# Patient Record
Sex: Male | Born: 1975 | Race: White | Hispanic: No | Marital: Married | Smoking: Former smoker
Health system: Southern US, Community
[De-identification: ages and names within clinical notes are randomized; demographics above are authoritative.]

## PROBLEM LIST (undated history)

## (undated) DIAGNOSIS — K3184 Gastroparesis: Secondary | ICD-10-CM

## (undated) DIAGNOSIS — K5900 Constipation, unspecified: Secondary | ICD-10-CM

## (undated) HISTORY — DX: Gastroparesis: K31.84

## (undated) HISTORY — DX: Constipation, unspecified: K59.00

---

## 1997-12-08 ENCOUNTER — Emergency Department (HOSPITAL_COMMUNITY): Admission: EM | Admit: 1997-12-08 | Discharge: 1997-12-08 | Payer: Self-pay | Admitting: Emergency Medicine

## 2006-06-04 ENCOUNTER — Ambulatory Visit: Payer: Self-pay | Admitting: Gastroenterology

## 2006-06-04 LAB — CONVERTED CEMR LAB: TSH: 2.11 microintl units/mL (ref 0.35–5.50)

## 2006-07-03 ENCOUNTER — Ambulatory Visit: Payer: Self-pay | Admitting: Gastroenterology

## 2006-07-09 ENCOUNTER — Ambulatory Visit: Payer: Self-pay | Admitting: Gastroenterology

## 2006-07-13 ENCOUNTER — Ambulatory Visit: Payer: Self-pay | Admitting: Gastroenterology

## 2006-07-17 ENCOUNTER — Ambulatory Visit (HOSPITAL_COMMUNITY): Admission: RE | Admit: 2006-07-17 | Discharge: 2006-07-17 | Payer: Self-pay | Admitting: Gastroenterology

## 2006-08-11 ENCOUNTER — Ambulatory Visit: Payer: Self-pay | Admitting: Gastroenterology

## 2007-06-09 DIAGNOSIS — K5900 Constipation, unspecified: Secondary | ICD-10-CM | POA: Insufficient documentation

## 2007-06-09 DIAGNOSIS — K3184 Gastroparesis: Secondary | ICD-10-CM

## 2007-06-09 HISTORY — DX: Gastroparesis: K31.84

## 2007-06-09 HISTORY — DX: Constipation, unspecified: K59.00

## 2009-08-23 IMAGING — US US ABDOMEN COMPLETE
1 series · 14 of 25 positions shown · non-contrast
Comparison: NONE

CLINICAL DATA: Abdominal pain. 

ABDOMINAL ULTRASOUND

[Series 1: us abd · 0.22mm/px · 14 of 85 slices shown]
[im 1/85]
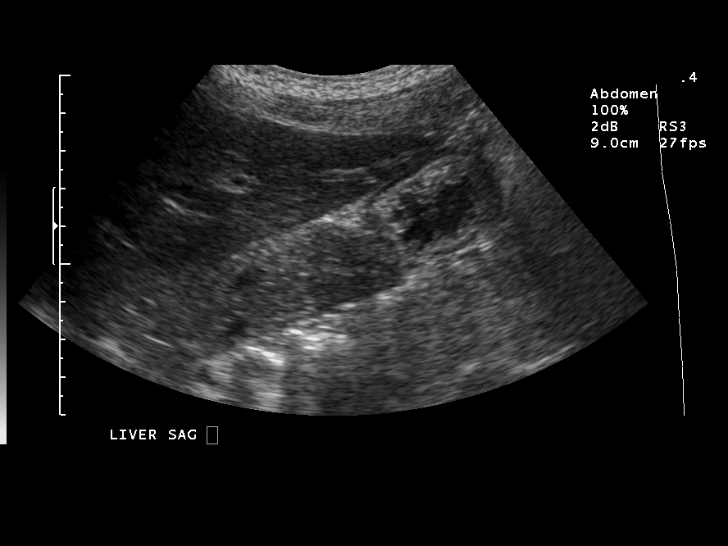
[im 8/85]
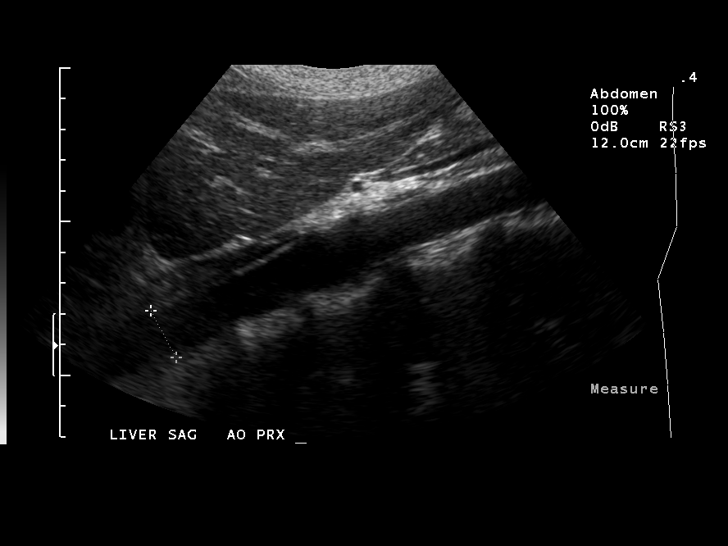
[im 15/85]
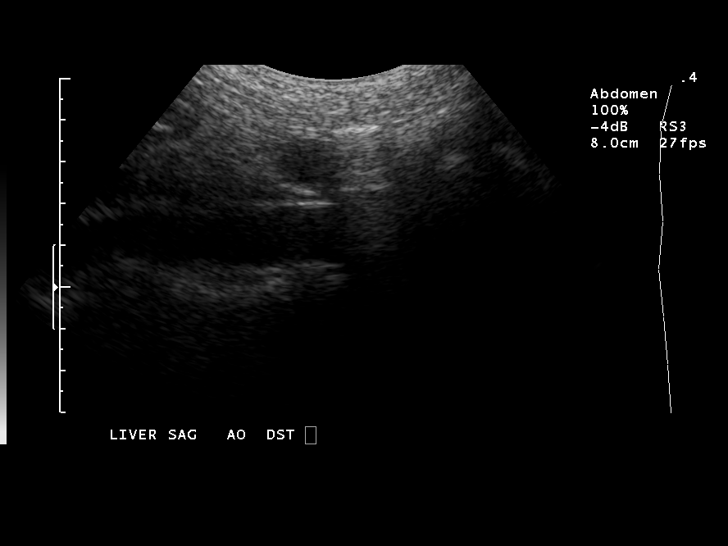
[im 22/85]
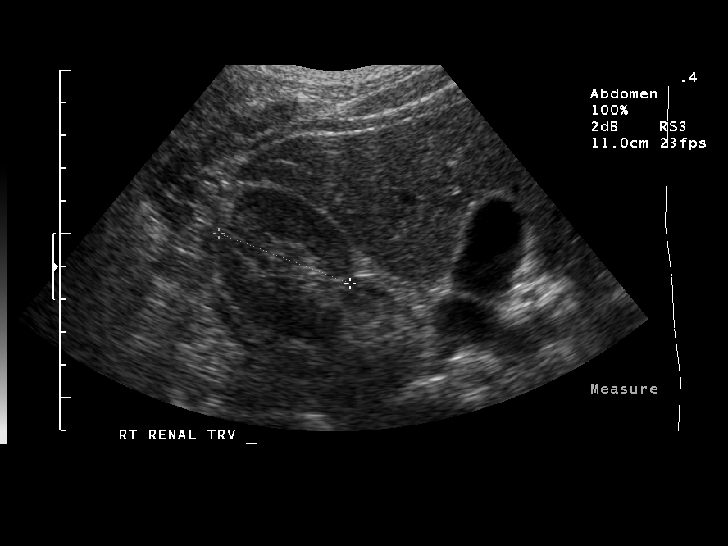
[im 29/85]
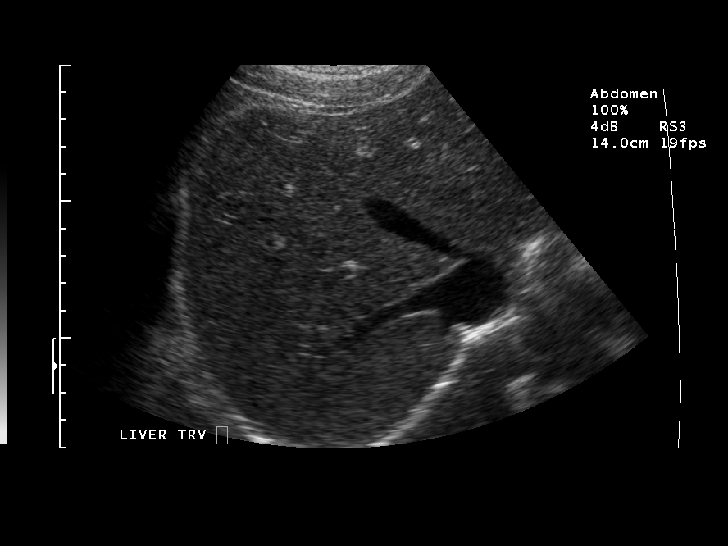
[im 32/85]
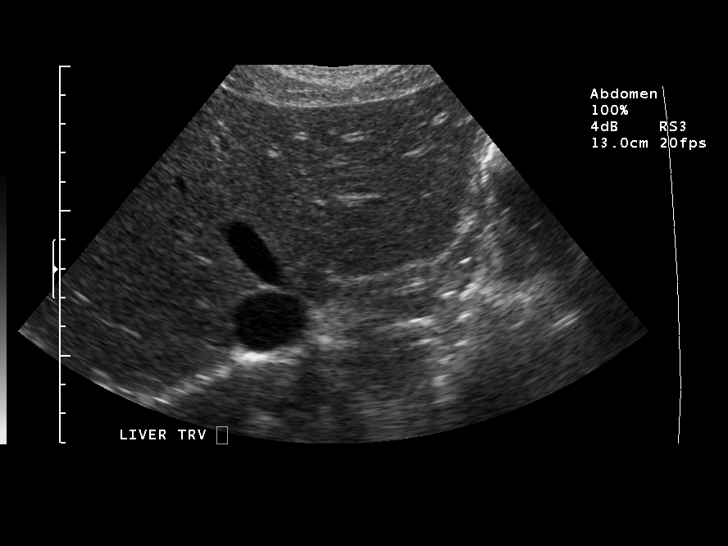
[im 39/85]
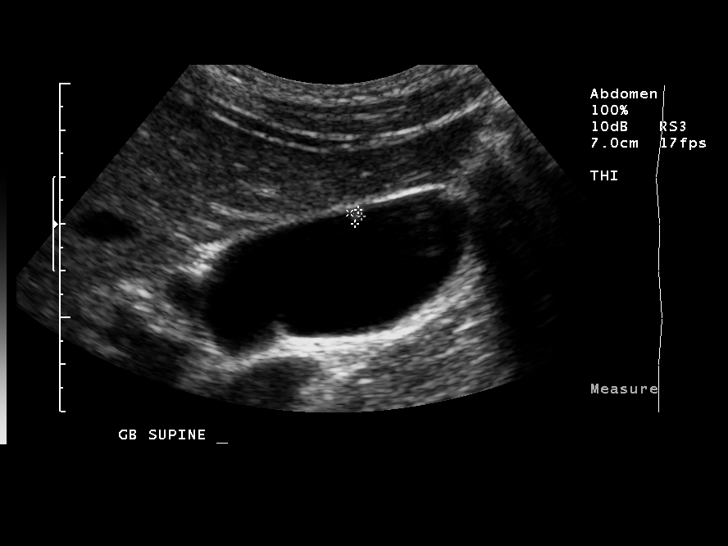
[im 46/85]
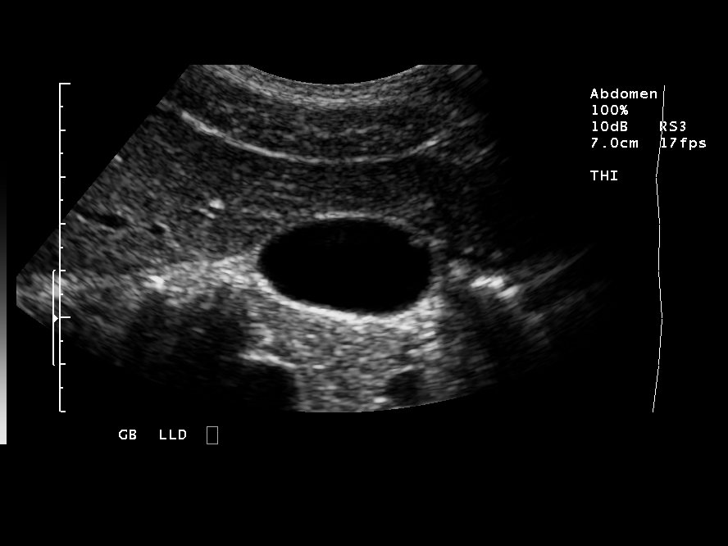
[im 53/85]
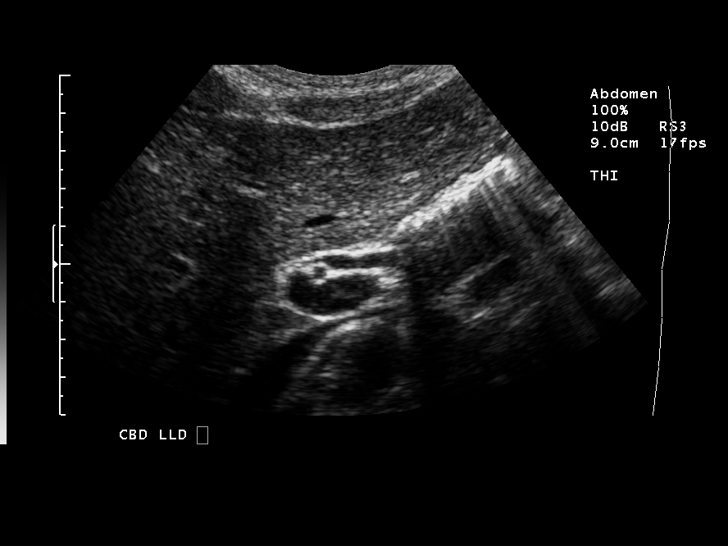
[im 57/85]
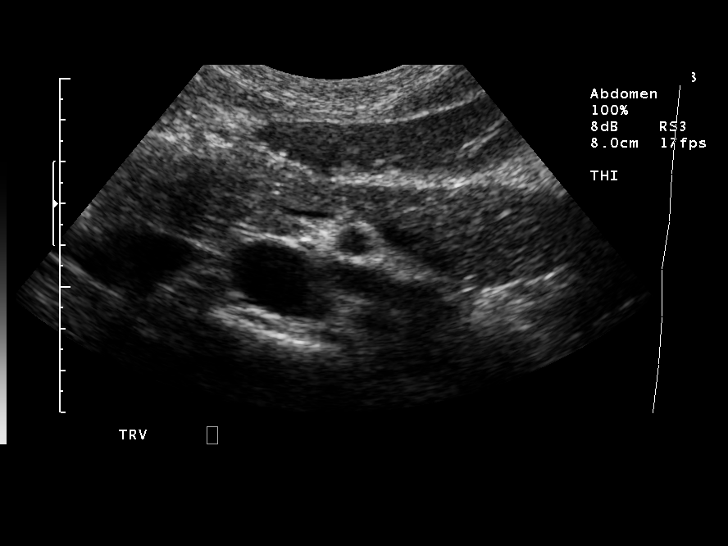
[im 64/85]
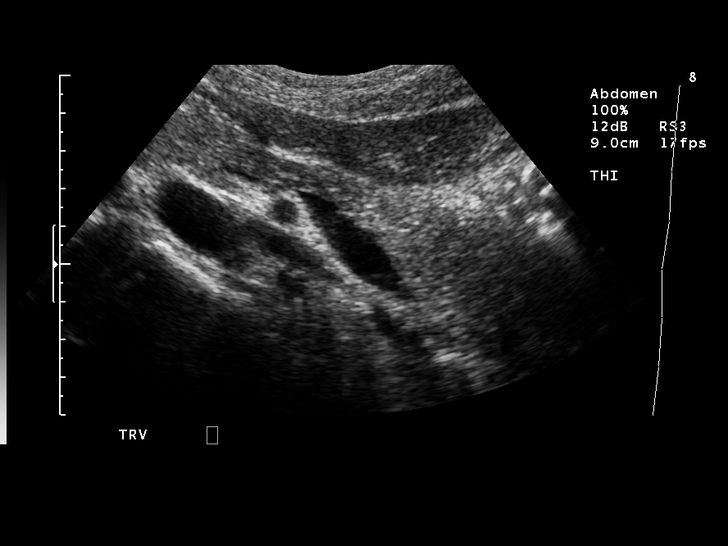
[im 71/85]
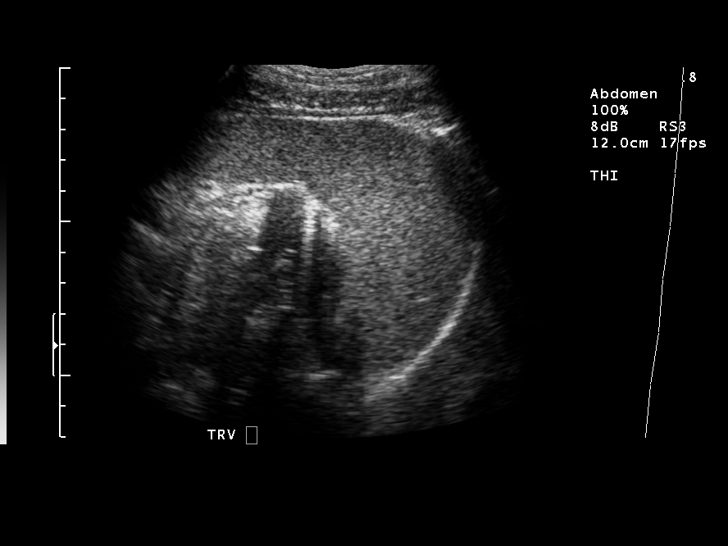
[im 78/85]
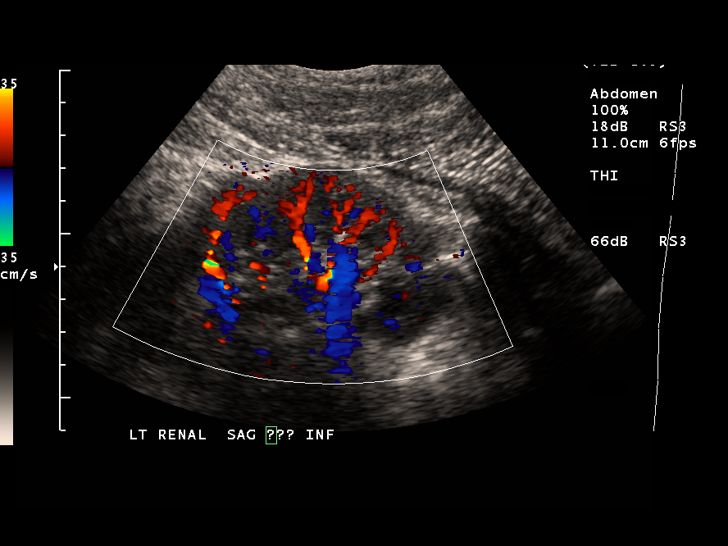
[im 85/85]
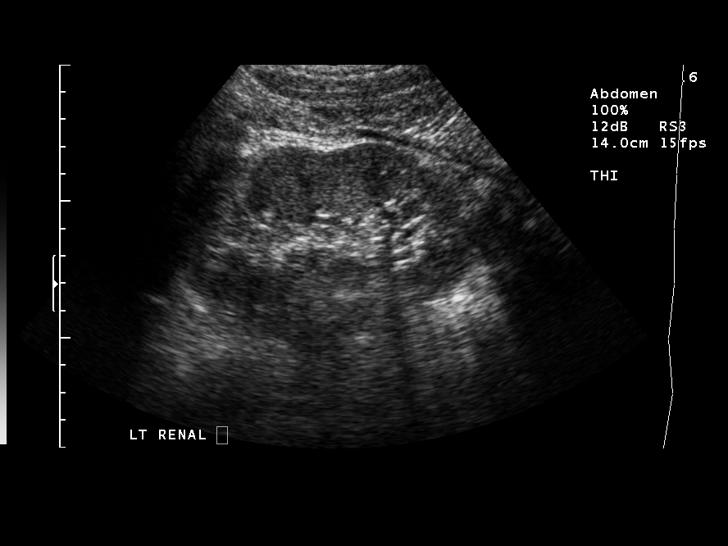

[14 of 25 positions shown; findings below may reference images not displayed]

FINDINGS: The liver is normal in size and echo appearance.  
Examination of the gallbladder demonstrates a solitary polyp 
within the gallbladder measuring 0.4 cm.  There were no stones 
identified. The common duct was non-dilated measuring 0.39 cm. The 
pancreas was normal in appearance.  The abdominal aorta was normal 
in caliber and echo appearance with AP measurements proximally of 
1.7 cm, mid 1.5 cm, and distally 1.2 cm.  The right kidney was 
normal in size and echo appearance measuring 11.4 cm. The left 
kidney measured 10.8 cm.  Echo appearances were normal.  The 
spleen measured 10.3 cm and was normal in echogenicity.
IMPRESSION: 0.4 cm gallbladder polyp otherwise negative exam. 
Abdelkbire Barhim, M.D. electronically reviewed on 12/31/2006 
Dict Date: 12/30/2006  Tran Date: 12/31/2006 CAV  TIREVICIENE

## 2010-03-12 NOTE — Procedures (Signed)
Summary: Gastroenterology Col  Gastroenterology Col   Imported By: June McMurray CMA 06/10/2007 12:10:16  _____________________________________________________________________  External Attachment:    Type:   Image     Comment:   External Document

## 2010-03-12 NOTE — Procedures (Signed)
Summary: Gastroenterology [facility]  Gastroenterology [facility]   Imported By: June McMurray CMA 06/10/2007 12:11:25  _____________________________________________________________________  External Attachment:    Type:   Image     Comment:   External Document

## 2010-06-25 NOTE — Assessment & Plan Note (Signed)
[facility]                         GASTROENTEROLOGY OFFICE NOTE   Henry Harrington, Henry Harrington                         MRN:          [id number]  DATE:07/13/2006                            DOB:          [birth year]    PROBLEM:  Abdominal pain.   [name] has returned complaining of abdominal pain. He awakened this  morning with severe right upper quadrant pain. It was without radiation.  It moved over to his left abdomen. He vomited once. He denies fever or  chills. A colonoscopy done 3 years ago was entirely normal as was his  upper endoscopy. He remains on Omeprazole daily.   PHYSICAL EXAMINATION:  VITAL SIGNS:  Pulse 58, blood pressure 112/66,  weight 127.  ABDOMEN:  Flat and without masses, tenderness or organomegaly. Bowel  sounds are active.   IMPRESSION:  Nonspecific abdominal pain.   RECOMMENDATIONS:  1. The patient is scheduled for a gastric-emptying exam.  2. NuLev 0.25 mg sublingual q.4 h p.r.n.     Barbette Hair. Arlyce Dice, MD,FACG  Electronically Signed    RDK/MedQ  DD: 07/13/2006  DT: 07/13/2006  Job #: [id number]   cc:   Peyton Najjar, MD

## 2010-06-25 NOTE — Assessment & Plan Note (Signed)
[facility]                         GASTROENTEROLOGY OFFICE NOTE   HAWKINS, SEAMAN                         MRN:          [id number]  DATE:08/11/2006                            DOB:          1976/02/05    PROBLEM:  Abdominal pain and nausea.   Mr. Glantz has returned for reevaluation.  Gastric emptying scan showed  marked delay with 62% retention in 2 hours.  Since starting Reglan 10 mg  one-half hour a.c. and h.s., he has been entirely symptom-free.  He is  without GI complaints.   EXAMINATION:  Pulse 60, blood pressure 86/60, weight 131.   IMPRESSION:  Idiopathic gastroparesis.   RECOMMENDATIONS:  1. Continue Reglan for at least 3 months.  At that point we will      attempt to taper it.  2. Hold omeprazole.     Barbette Hair. Arlyce Dice, MD,FACG  Electronically Signed    RDK/MedQ  DD: 08/11/2006  DT: 08/12/2006  Job #: [id number]   cc:   Peyton Najjar, MD

## 2010-06-28 NOTE — Assessment & Plan Note (Signed)
[facility]                         GASTROENTEROLOGY OFFICE NOTE   LEWAYNE, PAULEY                MRN:          [id number]  DATE:06/04/2006                            DOB:          [birth year]    REFERRING PHYSICIAN:  Peyton Najjar, MD   PROBLEM:  Constipation.   HISTORY OF PRESENT ILLNESS:  Henry Harrington is a pleasant, generally healthy, 35-  year-old white male with no other known chronic medical problems. He has  not had any previous GI disease or abdominal surgeries. He has had  relatively new onset of constipation since January or February of 2008.  He is not aware of any changes in his diet, activity level, etc., To  precipitate the constipation and is not on any regular medications. He  said that he had always had normal daily bowel movements and then got  constipated and was unable to have a bowel movement for 3 to 4 days at a  stretch. He actually developed some left sided abdominal discomfort with  significant constipation and was seen at Urgent Care. Had a KUB which  was negative and then was started on some MiraLax, which he has been  taking for the past month or so. He says that MiraLax works very well  with once daily dosing and he is having about 3 bowel movement per day.  He has never noticed any melena or hematochezia. Denies any abdominal  discomfort. His appetite is fine. His weight has been stable. He has  been trying to increase fiber in his diet and has been drinking a lot of  fluids.   CURRENT MEDICATIONS:  MiraLax 17 grams once daily.   ALLERGIES:  NO KNOWN DRUG ALLERGIES.   PAST MEDICAL HISTORY:  Is completely benign.   SOCIAL HISTORY:  The patient is married. He is a Psychiatrist. Does  not have any children. He is a nonsmoker, nondrinker.   FAMILY HISTORY:  Negative for colon cancer, polyps, or inflammatory  bowel disease, as far as he is aware.   REVIEW OF SYSTEMS:  HEENT:  Pertinent for eye glasses. BACK:   He does  have some intermittent back pain and muscle cramping. GENITOURINARY:  Negative. GASTROINTESTINAL:  As above. CARDIOVASCULAR:  Negative. All  other review of systems reviewed and negative.   PHYSICAL EXAMINATION:  GENERAL:  A well developed, thin, white male in  no acute distress.  VITAL SIGNS:  Height 5 foot 5. Weight is 128. Blood pressure 98/64.  Pulse in the 60's.  HEENT:  Normocephalic and atraumatic. EO MI. PERRLA. Sclerae anicteric.  NECK:  Supple without nodes.  CARDIOVASCULAR:  Regular rate and rhythm. S1 and S2. No murmur, rub, or  gallop.  ABDOMEN:  Soft. Bowel sounds active. He is non-tender. No mass or  hepatosplenomegaly.  RECTAL:  Examination is not done at this time.  EXTREMITIES:  Without clubbing, cyanosis, or edema.  NEUROLOGIC:  Grossly nonfocal. Alert and oriented times three and  appropriate.   LABORATORY DATA:  The patient did have baseline labs done through Urgent  Care with C-met completely normal. Amylase and lipase normal. WBC is  6.8.  Hemoglobin and hematocrit 15.2 and 44.7. These were done on May 26, 2006.   IMPRESSION:  A 35 year old white male with new onset constipation times  3 months. Suspect functional constipation, possible irritable bowel  syndrome. No alarm symptoms.   PLAN:  1. Check TSH.  2. Continue liberal fluids.  3. Have encouraged him to take a daily fiber supplement in the form of      Benefiber or Fiber-Sure and try to wean himself off of the MiraLax.      If this is ineffective, he may resume the MiraLax, perhaps on an      every other day basis.  4. He is to return p.r.n.  5. If his symptoms worsen, would then consider colonoscopy.      Mike Gip, PA-C  Electronically Signed      Barbette Hair. Arlyce Dice, MD,FACG  Electronically Signed   AE/MedQ  DD: 06/04/2006  DT: 06/04/2006  Job #: [id number]   cc:   Peyton Najjar, MD

## 2013-12-27 ENCOUNTER — Ambulatory Visit (INDEPENDENT_AMBULATORY_CARE_PROVIDER_SITE_OTHER): Payer: Self-pay | Admitting: Family Medicine

## 2013-12-27 VITALS — BP 118/78 | HR 93 | Temp 97.5°F | Resp 16 | Ht 67.0 in | Wt 160.6 lb

## 2013-12-27 DIAGNOSIS — L03114 Cellulitis of left upper limb: Secondary | ICD-10-CM

## 2013-12-27 MED ORDER — CEPHALEXIN 500 MG PO CAPS: 500.0000 mg | ORAL_CAPSULE | Freq: Three times a day (TID) | ORAL | Status: AC

## 2013-12-27 NOTE — Progress Notes (Signed)
D/w and agree with A/P . Dr Conley RollsLe

## 2013-12-27 NOTE — Progress Notes (Signed)
    MRN: [id number] DOB: 1975-11-21  Subjective:   Henry Harrington is a 38 y.o. male presenting for 2 day history of new onset elbow pain. First noticed elbow pain two days ago when he rested arms on a table top. Saw that it was red and swollen. 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 Ibuprofen has helped with pain, has been using ice but swelling is still increasing, elbow is also now warm to the touch. Denies numbness, tingling, loss of sensation, loss of ROM, fevers, n/v, diarrhea, chest pain, sob, confusion. Denies smoking or alcohol use. Denies any other aggravating or relieving factors, no other questions or concerns.  No current medications.   No Known Allergies  History reviewed. No pertinent past medical history.  History reviewed. No pertinent past surgical history.  ROS As in subjective.   Objective:   Vitals: BP 118/78 mmHg  Pulse 93  Temp(Src) 97.5 F (36.4 C) (Oral)  Resp 16  Ht 5\' 7"  (1.702 m)  Wt 160 lb 9.6 oz (72.848 kg)  BMI 25.15 kg/m2  SpO2 97%  Physical Exam  Constitutional: He is oriented to person, place, and time and well-developed, well-nourished, and in no distress. No distress.  Cardiovascular: Normal rate.  Exam reveals no gallop and no friction rub.   No murmur heard. Pulmonary/Chest: Effort normal. No respiratory distress.  Neurological: He is alert and oriented to person, place, and time.  Skin: Skin is warm and dry. He is not diaphoretic. There is erythema.     Psychiatric: Affect normal.     Assessment and Plan :   1. Cellulitis of left upper extremity - Likely cellulitis, Rx Keflex, warm compresses, if no resolution of symptoms with Keflex consider MRSA, bursitis otherwise return to clinic if symptoms worsen, fail to resolve or as needed - cephALEXin (KEFLEX) 500 MG capsule; Take 1 capsule (500 mg total) by mouth 3 (three) times daily.  Dispense: 30 capsule; Refill:  0   Wallis BambergMario Lenda Baratta, PA-C [facility] [phone] 12/27/2013 5:35 PM

## 2013-12-27 NOTE — Patient Instructions (Signed)

## 2013-12-30 ENCOUNTER — Telehealth: Payer: Self-pay | Admitting: Urgent Care

## 2013-12-30 NOTE — Telephone Encounter (Signed)
Spoke with patient to follow up on progress. Patient reports that since starting Keflex, his cellulitis has improved, redness, swelling and pain around his elbow has decreased significantly. Advised patient to complete antibiotic course and advised to return to clinic if symptoms worsen, fail to resolve or as needed.  Wallis BambergMario Georjean Toya, PA-C [facility] [phone] 12/30/2013  8:31 AM

## 2014-01-06 ENCOUNTER — Telehealth: Payer: Self-pay

## 2014-01-06 NOTE — Telephone Encounter (Signed)
Patient was seen for cellulitis on elbow and still has a bump- still large and has taken meds for it only has one pill left to take. He is concernd if there is fluid in it or can we drain? Patient wants to know what to do for this. Please advise. He has only used warm compress once. No change.   Best: [phone]

## 2014-01-07 ENCOUNTER — Ambulatory Visit (INDEPENDENT_AMBULATORY_CARE_PROVIDER_SITE_OTHER): Payer: Self-pay | Admitting: Family Medicine

## 2014-01-07 VITALS — BP 116/78 | HR 60 | Temp 98.2°F | Resp 16 | Ht 66.5 in | Wt 158.4 lb

## 2014-01-07 DIAGNOSIS — M7022 Olecranon bursitis, left elbow: Secondary | ICD-10-CM

## 2014-01-07 MED ORDER — CEPHALEXIN 500 MG PO CAPS: 500.0000 mg | ORAL_CAPSULE | Freq: Three times a day (TID) | ORAL | Status: AC

## 2014-01-07 NOTE — Telephone Encounter (Signed)
Spoke to pt. The way he describes the "bump" on his elbow makes it sound like he could be developing an abscess. I advised the pt to come in either today or tomorrow to be re-evaluated. I spoke to Gurney MaxinMike Mani regarding pt and he agrees that patient needs to come back for a re-check. Pt is concerned about financial situation; I tried to reassure him that we would work with him on cost the best we could. Hopefully pt will return for a re-check.

## 2014-01-07 NOTE — Patient Instructions (Signed)
Olecranon Bursitis Bursitis is swelling and soreness (inflammation) of a fluid-filled sac (bursa) that covers and protects a joint. Olecranon bursitis occurs over the elbow.  CAUSES Bursitis can be caused by injury, overuse of the joint, arthritis, or infection.  SYMPTOMS   Tenderness, swelling, warmth, or redness over the elbow.  Elbow pain with movement. This is greater with bending the elbow.  Squeaking sound when the bursa is rubbed or moved.  Increasing size of the bursa without pain or discomfort.  Fever with increasing pain and swelling if the bursa becomes infected. HOME CARE INSTRUCTIONS   Put ice on the affected area.  Put ice in a plastic bag.  Place a towel between your skin and the bag.  Leave the ice on for 15-20 minutes each hour while awake. Do this for the first 2 days.  When resting, elevate your elbow above the level of your heart. This helps reduce swelling.  Continue to put the joint through a full range of motion 4 times per day. Rest the injured joint at other times. When the pain lessens, begin normal slow movements and usual activities.  Only take over-the-counter or prescription medicines for pain, discomfort, or fever as directed by your caregiver.  Reduce your intake of milk and related dairy products (cheese, yogurt). They may make your condition worse. SEEK IMMEDIATE MEDICAL CARE IF:   Your pain increases even during treatment.  You have a fever.  You have heat and inflammation over the bursa and elbow.  You have a red line that goes up your arm.  You have pain with movement of your elbow. MAKE SURE YOU:   Understand these instructions.  Will watch your condition.  Will get help right away if you are not doing well or get worse. Document Released: 02/26/2006 Document Revised: 04/21/2011 Document Reviewed: 01/12/2007 Exit[employer] Patient Information 2015 [employer]. This information is not intended to replace advice given to you by your  health care provider. Make sure you discuss any questions you have with your health care provider.  

## 2014-01-08 NOTE — Progress Notes (Signed)
    MRN: [id number] DOB: [birth year]  Subjective:   Henry Harrington is a 38 y.o. male presenting for 2 week history of elbow swelling. Patient was seen here on 12/27/2013 for same complaint, treated conservatively with Keflex for cellulitis. Discussed possibility of septic bursitis and need for doxycycline with patient at previous visit but due to financial restraints, patient decided to go with a more conservative approach. Today, he notes significant improvement in erythema and swelling. Patient completed course of Keflex 01/06/2014, is concerned that swelling right over his elbow persists despite antibiotics and warm compresses. Denies fevers, n/v, diarrhea, decreased ROM or sensation, pain, streaking from elbow, pus, drainage. Denies any other aggravating or relieving factors, no other questions or concerns.  Prior to Admission medications   Medication Sig Start Date End Date Taking? Authorizing Provider  cephALEXin (KEFLEX) 500 MG capsule Take 1 capsule (500 mg total) by mouth 3 (three) times daily. 01/07/14 01/12/14  Wallis BambergMario Jontae Sonier, PA-C   No Known Allergies  Denies pmh.  Denies psh.  ROS As in subjective.   Objective:   Vitals: BP 116/78 mmHg  Pulse 60  Temp(Src) 98.2 F (36.8 C) (Oral)  Resp 16  Ht 5' 6.5" (1.689 m)  Wt 158 lb 6 oz (71.838 kg)  BMI 25.18 kg/m2  SpO2 99%  Physical Exam  Constitutional: He is oriented to person, place, and time and well-developed, well-nourished, and in no distress.  Cardiovascular: Normal rate.   Pulmonary/Chest: Effort normal.  Musculoskeletal: Normal range of motion. He exhibits edema (mild edema over olecranon process). He exhibits no tenderness.  Neurological: He is alert and oriented to person, place, and time.  Skin: Skin is warm and dry. No rash noted. He is not diaphoretic. There is erythema (mild erythema over olecranon process).    Assessment and Plan :   1. Olecranon bursitis, left - cellulitis significantly improved - will Rx an  additional 5 days of Keflex and warm compresses for olecranon bursitis - if no improvement with additional antibiotics consider aspiration and culture - cephALEXin (KEFLEX) 500 MG capsule; Take 1 capsule (500 mg total) by mouth 3 (three) times daily.  Dispense: 15 capsule; Refill: 0   Wallis BambergMario Bevan Vu, PA-C [facility] [phone] 01/07/2014  17:01

## 2014-01-10 NOTE — Progress Notes (Signed)
Agree with A/P,pt elbow examined, options reviewed with pt, risk and benfits reviewed with conservative management vs aspiration. Dr Conley RollsLe

## 2014-09-13 ENCOUNTER — Ambulatory Visit (INDEPENDENT_AMBULATORY_CARE_PROVIDER_SITE_OTHER): Payer: 59 | Admitting: Physician Assistant

## 2014-09-13 VITALS — BP 118/76 | HR 66 | Temp 98.0°F | Resp 16 | Ht 67.0 in | Wt 153.4 lb

## 2014-09-13 DIAGNOSIS — H10021 Other mucopurulent conjunctivitis, right eye: Secondary | ICD-10-CM

## 2014-09-13 MED ORDER — MOXIFLOXACIN HCL 0.5 % OP SOLN - NO CHARGE: 1.0000 [drp] | Freq: Three times a day (TID) | OPHTHALMIC | Status: AC

## 2014-09-13 NOTE — Progress Notes (Signed)
   Patient ID: Henry Harrington, male    DOB: 20-Feb-1975, 39 y.o.   MRN: [id number]  PCP: No PCP Per Patient  Subjective:   Chief Complaint  Patient presents with  . Eye Problem     rt., pt. thinks pink eye, x this morning     HPI Presents for evaluation of right eye redness and pain.   He states that this morning when he woke up it was matted closed and when he opened it it was red. Pt denies FB sensation, changes in vision, pruritis, pain, or burning. He states that the eye feels "scratchy" when he blinks. Pt denies recent URI or infection. Pt reports watery discharge from the eye. Pt states that his left eye itches but has not been watery, painful, or red. Pt denies fever. Pt states that his wife put an OTC drop in his right eye this morning. He denies trying any other medications or hot/cold compress.   Pt states that 2 of his 3 children have been diagnosed with pink eye this week. He stays at home with his 3 children and denies working outside, use of power tools, or any circumstance in which chemicals, metal, wood, or other objects could have gotten in his eye. Pt denies a history of environmental/seasonal allergies.  Review of Systems Constitutional: Negative.  HENT: Negative for congestion, ear pain, postnasal drip, sinus pressure, sneezing, sore throat, tinnitus, trouble swallowing and voice change.  Eyes: Positive for discharge, redness and itching. Negative for photophobia, pain and visual disturbance.  Respiratory: Negative.  Cardiovascular: Negative.  Gastrointestinal: Negative.  Skin: Negative.  Allergic/Immunologic: Negative.  Neurological: Negative.      Patient Active Problem List   Diagnosis Date Noted  . 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 CONSTIPATION 06/09/2007     Prior to Admission medications   Medication Sig Start Date End Date Taking? Authorizing Provider  NONE   No Known Allergies     Objective:  Physical Exam  Constitutional: He is oriented to person,  place, and time. He appears well-developed and well-nourished. He is active and cooperative. No distress.  BP 118/76 mmHg  Pulse 66  Temp(Src) 98 F (36.7 C) (Oral)  Resp 16  Ht  (1.702 m)  Wt 153 lb 6.4 oz (69.582 kg)  BMI 24.02 kg/m2  SpO2 99%   HENT:  Head: Normocephalic and atraumatic.  Eyes: EOM and lids are normal. Pupils are equal, round, and reactive to light. Right conjunctiva is injected. Right conjunctiva has no hemorrhage. Left conjunctiva is not injected. Left conjunctiva has no hemorrhage.  Crusting on the lashes. Some yellowish drainage noted at the medial and lateral canthi.  Pulmonary/Chest: Effort normal.  Neurological: He is alert and oriented to person, place, and time.  Psychiatric: He has a normal mood and affect. His speech is normal and behavior is normal.           Assessment & Plan:   1. Pink eye disease of right eye Anticipatory guidance provided. Infection control to reduce risk on transmission reviewed. - moxifloxacin (VIGAMOX) 0.5 % SOLN; Place 1 drop into the right eye 3 (three) times daily.  Dispense: 3 mL; Refill: 0   Fernande Bras, PA-C Physician Assistant-Certified [facility]

## 2014-09-13 NOTE — Patient Instructions (Addendum)
You can also try warm compresses over the affected eye for symptom relief. Clean all the surfaces in your house, repeatedly until everyone's symptoms are resolved. Wash your bed linens frequently. Wash your hands regularly, and use an antibacterial hand sanitizer in between. Please come back to clinic if you experience fever, chills, or any loss of vision.

## 2014-09-13 NOTE — Progress Notes (Signed)
   Subjective:    Patient ID: Henry Harrington, male    DOB: Dec 19, 1975, 39 y.o.   MRN: [id number]  HPI Pt presents for evaluation of his right eye. He states that this morning when he woke up it was matted closed and when you opened it it was red. Pt denies FB sensation, changes in vision, pruritis, pain, or burning. He states that the eye feels "scratchy" when he blinks. Pt denies recent URI or infection. Pt reports watery discharge from the eye. Pt states that his left eye itches but has not been watery, painful, or red. Pt denies fever. Pt states that his wife put an OTC drop in his right eye this morning. He denies trying any other medications or hot/cold compress. Pt states that 2 of his 3 children have been diagnosed with pink eye this week. He stays at home with his 3 children and denies working outside, use of power tools, or any circumstance in which chemicals, metal, wood, or other objects could have gotten in his eye. Pt denies a history of environmental/seasonal allergies. Review of Systems  Constitutional: Negative.   HENT: Negative for congestion, ear pain, postnasal drip, sinus pressure, sneezing, sore throat, tinnitus, trouble swallowing and voice change.   Eyes: Positive for discharge, redness and itching. Negative for photophobia, pain and visual disturbance.  Respiratory: Negative.   Cardiovascular: Negative.   Gastrointestinal: Negative.   Skin: Negative.   Allergic/Immunologic: Negative.   Neurological: Negative.       Objective:   Physical Exam  Constitutional: He is oriented to person, place, and time. He appears well-developed and well-nourished. No distress.  HENT:  Head: Normocephalic and atraumatic.    Right Ear: External ear normal.  Left Ear: External ear normal.  Eyes: EOM are normal. Pupils are equal, round, and reactive to light. Right eye exhibits discharge. Left eye exhibits no discharge. No scleral icterus.  Neck: Normal range of motion. Neck supple. No  tracheal deviation present. No thyromegaly present.  Lymphadenopathy:    He has no cervical adenopathy.  Neurological: He is alert and oriented to person, place, and time.  Skin: Skin is warm and dry. No rash noted. He is not diaphoretic. No erythema. No pallor.  Psychiatric: He has a normal mood and affect. His behavior is normal. Judgment and thought content normal.          Assessment & Plan:

## 2014-11-20 ENCOUNTER — Encounter: Payer: Self-pay | Admitting: Physician Assistant

## 2014-11-20 ENCOUNTER — Ambulatory Visit (INDEPENDENT_AMBULATORY_CARE_PROVIDER_SITE_OTHER): Payer: 59 | Admitting: Physician Assistant

## 2014-11-20 VITALS — BP 106/70 | HR 57 | Temp 98.4°F | Resp 16 | Ht 66.75 in | Wt 152.2 lb

## 2014-11-20 DIAGNOSIS — Z1329 Encounter for screening for other suspected endocrine disorder: Secondary | ICD-10-CM

## 2014-11-20 DIAGNOSIS — Z23 Encounter for immunization: Secondary | ICD-10-CM

## 2014-11-20 DIAGNOSIS — Z114 Encounter for screening for human immunodeficiency virus [HIV]: Secondary | ICD-10-CM

## 2014-11-20 DIAGNOSIS — Z130 Encounter for screening for diseases of the blood and blood-forming organs and certain disorders involving the immune mechanism: Secondary | ICD-10-CM

## 2014-11-20 DIAGNOSIS — Z13228 Encounter for screening for other metabolic disorders: Secondary | ICD-10-CM

## 2014-11-20 DIAGNOSIS — Z0000 Encounter for general adult medical examination without abnormal findings: Secondary | ICD-10-CM | POA: Diagnosis not present

## 2014-11-20 DIAGNOSIS — Z13220 Encounter for screening for lipoid disorders: Secondary | ICD-10-CM | POA: Diagnosis not present

## 2014-11-20 LAB — LIPID PANEL
Cholesterol: 162 mg/dL (ref 125–200)
HDL: 33 mg/dL — ABNORMAL LOW (ref >=40)
LDL Cholesterol: 119 mg/dL (ref <130)
Total CHOL/HDL Ratio: 4.9 Ratio (ref <=5.0)
Triglycerides: 48 mg/dL (ref <150)
VLDL: 10 mg/dL (ref <30)

## 2014-11-20 LAB — CBC
HCT: 43.4 % (ref 39.0–52.0)
Hemoglobin: 14.9 g/dL (ref 13.0–17.0)
MCH: 30.3 pg (ref 26.0–34.0)
MCHC: 34.3 g/dL (ref 30.0–36.0)
MCV: 88.2 fL (ref 78.0–100.0)
MPV: 9.7 fL (ref 8.6–12.4)
Platelets: 217 10*3/uL (ref 150–400)
RBC: 4.92 MIL/uL (ref 4.22–5.81)
RDW: 13.1 % (ref 11.5–15.5)
WBC: 5.3 10*3/uL (ref 4.0–10.5)

## 2014-11-20 LAB — POCT URINALYSIS DIP (MANUAL ENTRY)
Bilirubin, UA: NEGATIVE
Blood, UA: NEGATIVE
Glucose, UA: NEGATIVE
Ketones, POC UA: NEGATIVE
Leukocytes, UA: NEGATIVE
Nitrite, UA: NEGATIVE
Protein Ur, POC: NEGATIVE
Spec Grav, UA: 1.025
Urobilinogen, UA: 0.2
pH, UA: 5

## 2014-11-20 LAB — COMPREHENSIVE METABOLIC PANEL
ALT: 20 U/L (ref 9–46)
AST: 17 U/L (ref 10–40)
Albumin: 4.3 g/dL (ref 3.6–5.1)
Alkaline Phosphatase: 68 U/L (ref 40–115)
BUN: 12 mg/dL (ref 7–25)
CO2: 28 mmol/L (ref 20–31)
Calcium: 9.1 mg/dL (ref 8.6–10.3)
Chloride: 107 mmol/L (ref 98–110)
Creat: 0.93 mg/dL (ref 0.60–1.35)
Glucose, Bld: 90 mg/dL (ref 65–99)
Potassium: 4.3 mmol/L (ref 3.5–5.3)
Sodium: 136 mmol/L (ref 135–146)
Total Bilirubin: 0.4 mg/dL (ref 0.2–1.2)
Total Protein: 6.7 g/dL (ref 6.1–8.1)

## 2014-11-20 LAB — HIV ANTIBODY (ROUTINE TESTING W REFLEX): HIV 1&2 Ab, 4th Generation: NONREACTIVE

## 2014-11-20 LAB — TSH: TSH: 2.405 u[IU]/mL (ref 0.350–4.500)

## 2014-11-20 NOTE — Progress Notes (Signed)
Subjective:    Patient ID: Henry Harrington, male    DOB: 04-Mar-1975, 39 y.o.   MRN: [id number]  PCP: Kobe Jansma, PA-C  Chief Complaint  Patient presents with  . Annual Exam    HPI  Presents for annual wellness visit.  No concerns or complaints. No problems to address.   No Known Allergies   Prior to Admission medications   Not on File    Patient Active Problem List   Diagnosis Date Noted  . 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 CONSTIPATION 06/09/2007    History reviewed. No pertinent past medical history.  Social History   Social History  . Marital Status: Married    Spouse Name: Zac Torti  . Number of Children: 3  . Years of Education: 12th grade   Occupational History  . 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 Smoking status: Former Smoker    Quit date: 02/10/1997  . Smokeless tobacco: Never Used  . Alcohol Use: No  . Drug Use: No  . Sexual Activity:    Partners: Female   Other Topics Concern  . Not on file   Social History Narrative   Exercise: No.    He has two daughters and one son.    Former Engineer, manufacturing       Family History  Problem Relation Age of Onset  . Cancer Father   . Stroke Father     Review of Systems  Constitutional: Negative.   HENT: Negative.   Eyes: Negative.   Cardiovascular: Negative.   Gastrointestinal: Negative.   Endocrine: Negative.   Genitourinary: Negative.   Musculoskeletal: Negative.   Skin: Negative.   Allergic/Immunologic: Negative.   Neurological: Negative.   Hematological: Negative.   Psychiatric/Behavioral: Negative.    Depression screen [facility] 2/9 11/20/2014 09/13/2014  Decreased Interest 0 0  Down, Depressed, Hopeless 0 0  PHQ - 2 Score 0 0        Objective:   Physical Exam  Constitutional: He is oriented to person, place, and time. Vital signs are normal. He appears well-developed and well-nourished. He is active and cooperative.  Non-toxic appearance. He does not have a sickly  appearance. He does not appear ill. No distress.  HENT:  Head: Normocephalic and atraumatic.  Right Ear: Hearing, tympanic membrane, external ear and ear canal normal.  Left Ear: Hearing, tympanic membrane, external ear and ear canal normal.  Nose: Nose normal.  Mouth/Throat: Uvula is midline, oropharynx is clear and moist and mucous membranes are normal. He does not have dentures. No oral lesions. No trismus in the jaw. Normal dentition. No dental abscesses, uvula swelling, lacerations or dental caries.  Eyes: Conjunctivae, EOM and lids are normal. Pupils are equal, round, and reactive to light. Right eye exhibits no discharge. Left eye exhibits no discharge. No scleral icterus.  Fundoscopic exam:      The right eye shows no arteriolar narrowing, no AV nicking, no exudate, no hemorrhage and no papilledema.       The left eye shows no arteriolar narrowing, no AV nicking, no exudate, no hemorrhage and no papilledema.  Neck: Normal range of motion, full passive range of motion without pain and phonation normal. Neck supple. No spinous process tenderness and no muscular tenderness present. No rigidity. No tracheal deviation, no edema, no erythema and normal range of motion present. No thyromegaly present.  Cardiovascular: Normal rate, regular rhythm, S1 normal, S2 normal, normal heart sounds, intact distal pulses and normal pulses.  Exam reveals no gallop and no friction rub.   No murmur heard. Pulmonary/Chest: Effort normal and breath sounds normal. No respiratory distress. He has no wheezes. He has no rales.  Abdominal: Soft. Normal appearance and bowel sounds are normal. He exhibits no distension and no mass. There is no hepatosplenomegaly. There is no tenderness. There is no rebound and no guarding. No hernia. Hernia confirmed negative in the right inguinal area and confirmed negative in the left inguinal area.  Genitourinary: Testes normal and penis normal. No phimosis, paraphimosis, hypospadias,  penile erythema or penile tenderness. No discharge found.  Musculoskeletal: Normal range of motion. He exhibits no edema or tenderness.       Right shoulder: Normal.       Left shoulder: Normal.       Right elbow: Normal.      Left elbow: Normal.       Right wrist: Normal.       Left wrist: Normal.       Right hip: Normal.       Left hip: Normal.       Right knee: Normal.       Left knee: Normal.       Right ankle: Normal. Achilles tendon normal.       Left ankle: Normal. Achilles tendon normal.       Cervical back: Normal. He exhibits normal range of motion, no tenderness, no bony tenderness, no swelling, no edema, no deformity, no laceration, no pain, no spasm and normal pulse.       Thoracic back: Normal.       Lumbar back: Normal.       Right upper arm: Normal.       Left upper arm: Normal.       Right forearm: Normal.       Left forearm: Normal.       Right hand: Normal.       Left hand: Normal.       Right upper leg: Normal.       Left upper leg: Normal.       Right lower leg: Normal.       Left lower leg: Normal.       Right foot: Normal.       Left foot: Normal.  Lymphadenopathy:       Head (right side): No submental, no submandibular, no tonsillar, no preauricular, no posterior auricular and no occipital adenopathy present.       Head (left side): No submental, no submandibular, no tonsillar, no preauricular, no posterior auricular and no occipital adenopathy present.    He has no cervical adenopathy.       Right: No inguinal and no supraclavicular adenopathy present.       Left: No inguinal and no supraclavicular adenopathy present.  Neurological: He is alert and oriented to person, place, and time. He has normal strength and normal reflexes. He displays no tremor. No cranial nerve deficit. He exhibits normal muscle tone. Coordination and gait normal.  Skin: Skin is warm, dry and intact. No abrasion, no ecchymosis, no laceration, no lesion and no rash noted. He is not  diaphoretic. No cyanosis or erythema. No pallor. Nails show no clubbing.  Psychiatric: He has a normal mood and affect. His speech is normal and behavior is normal. Judgment and thought content normal. Cognition and memory are normal.          Assessment & Plan:  1. Annual physical exam Age appropriate anticipatory guidance  provided.  2. Screening for deficiency anemia - CBC  3. Screening for thyroid disorder - TSH  4. Screening for metabolic disorder - Comprehensive metabolic panel - POCT urinalysis dipstick  5. Screening for lipid disorders - Lipid panel  6. Screening for HIV (human immunodeficiency virus) - HIV antibody  7. Need for influenza vaccination - Flu Vaccine QUAD 36+ mos IM  8. Need for Tdap vaccination - Tdap vaccine greater than or equal to 7yo IM   Fernande Bras, PA-C Physician Assistant-Certified [facility]

## 2014-11-20 NOTE — Progress Notes (Signed)
Subjective:     Patient ID: Henry Harrington, male   DOB: [birth year], 39 y.o.   MRN: [id number]   PCP: JEFFERY,CHELLE, PA-C  Chief Complaint  Patient presents with  . Annual Exam    HPI  Patient presents today for complete physical exam. He currently has no complaints he would like addressed.    Review of Systems  Constitutional: Negative for fever and chills.  HENT: Negative for congestion, rhinorrhea, sinus pressure and sore throat.   Eyes: Negative for photophobia and visual disturbance.  Respiratory: Negative for cough, chest tightness and shortness of breath.   Cardiovascular: Negative for chest pain.  Gastrointestinal: Negative for nausea, vomiting, abdominal pain, diarrhea and constipation.  Genitourinary: Negative for dysuria, urgency and frequency.  Musculoskeletal: Negative for myalgias and arthralgias.  Skin: Negative for rash.  Neurological: Negative for dizziness, syncope, light-headedness and headaches.  Psychiatric/Behavioral: Negative.      Patient Active Problem List   Diagnosis Date Noted  . 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 CONSTIPATION 06/09/2007    Social History   Social History  . Marital Status: Married    Spouse Name: Dontez Hauss  . Number of Children: 3  . Years of Education: 12th grade   Occupational History  . 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 Smoking status: Former Smoker    Quit date: 02/10/1997  . Smokeless tobacco: Never Used  . Alcohol Use: No  . Drug Use: No  . Sexual Activity:    Partners: Female   Other Topics Concern  . Not on file   Social History Narrative   Exercise: No.    He has two daughters and one son.    Former Engineer, manufacturing        Prior to Admission medications   Not on File    No Known Allergies   Objective:  Physical Exam  Constitutional: He is oriented to person, place, and time. He appears well-developed and well-nourished.  HENT:  Head: Normocephalic and atraumatic.  Right  Ear: Hearing, tympanic membrane, external ear and ear canal normal.  Left Ear: Hearing, external ear and ear canal normal.  Nose: Nose normal.  Mouth/Throat: Uvula is midline, oropharynx is clear and moist and mucous membranes are normal.  Eyes: Conjunctivae are normal. Pupils are equal, round, and reactive to light.  Neck: Normal range of motion. Neck supple. No thyromegaly present.  Cardiovascular: Normal rate, regular rhythm, intact distal pulses and normal pulses.   Pulmonary/Chest: Effort normal and breath sounds normal.  Abdominal: Soft. Bowel sounds are normal. There is no tenderness. Hernia confirmed negative in the right inguinal area and confirmed negative in the left inguinal area.  Genitourinary: Testes normal and penis normal. Right testis shows no mass, no swelling and no tenderness. Left testis shows no mass, no swelling and no tenderness. Circumcised.  Lymphadenopathy:       Head (right side): No submental, no submandibular, no tonsillar, no preauricular and no posterior auricular adenopathy present.       Head (left side): No submental, no submandibular, no tonsillar, no preauricular and no posterior auricular adenopathy present.    He has no cervical adenopathy.       Right: No inguinal adenopathy present.       Left: No inguinal adenopathy present.  Neurological: He is alert and oriented to person, place, and time. He has normal reflexes.  Skin: Skin is warm and dry.  Psychiatric: He has a normal mood and affect. His behavior  is normal. Thought content normal.     BP 106/70 mmHg  Pulse 57  Temp(Src) 98.4 F (36.9 C) (Oral)  Resp 16  Ht 5' 6.75" (1.695 m)  Wt 152 lb 3.2 oz (69.037 kg)  BMI 24.03 kg/m2  SpO2 97%   Assessment & Plan:  1. Annual physical exam No abnormal findings. Anticipatory guidance.  2. Screening for deficiency anemia - CBC  3. Screening for thyroid disorder - TSH  4. Screening for metabolic disorder - Comprehensive metabolic panel -  POCT urinalysis dipstick  5. Screening for lipid disorders - Lipid panel  6. Screening for HIV (human immunodeficiency virus) - HIV antibody  7. Need for influenza vaccination - Flu Vaccine QUAD 36+ mos IM  8. Need for Tdap vaccination - Tdap vaccine greater than or equal to 7yo IM    Follow-up in 1 year (around 11/20/2015) or earlier as needed.    Noemi Bellissimo D. Race, PA-S Physician Assistant Student [facility]

## 2014-11-20 NOTE — Patient Instructions (Addendum)
Keeping you healthy  Get these tests  Blood pressure- Have your blood pressure checked once a year by your healthcare provider.  Normal blood pressure is 120/80.  Weight- Have your body mass index (BMI) calculated to screen for obesity.  BMI is a measure of body fat based on height and weight. You can also calculate your own BMI at https://www.west-esparza.com/.  Cholesterol- Have your cholesterol checked regularly starting at age 37, sooner may be necessary if you have diabetes, high blood pressure, if a family member developed heart diseases at an early age or if you smoke.   Chlamydia, HIV, and other sexual transmitted disease- Get screened each year until the age of 66 then within three months of each new sexual partner.  Diabetes- Have your blood sugar checked regularly if you have high blood pressure, high cholesterol, a family history of diabetes or if you are overweight.  Get these vaccines  Flu shot- Every fall.  Tetanus shot- Every 10 years.  Menactra- Single dose; prevents meningitis.  Take these steps  Don't smoke- If you do smoke, ask your healthcare provider about quitting. For tips on how to quit, go to www.smokefree.gov or call 1-800-QUIT-NOW.  Be physically active- Exercise 5 days a week for at least 30 minutes.  If you are not already physically active start slow and gradually work up to 30 minutes of moderate physical activity.  Examples of moderate activity include walking briskly, mowing the yard, dancing, swimming bicycling, etc.  Eat a healthy diet- Eat a variety of healthy foods such as fruits, vegetables, low fat milk, low fat cheese, yogurt, lean meats, poultry, fish, beans, tofu, etc.  For more information on healthy eating, go to www.thenutritionsource.org  Drink alcohol in moderation- Limit alcohol intake two drinks or less a day.  Never drink and drive.  Dentist- Brush and floss teeth twice daily; visit your dentis twice a year.  Depression-Your emotional  health is as important as your physical health.  If you're feeling down, losing interest in things you normally enjoy please talk with your healthcare provider.  Gun Safety- If you keep a gun in your home, keep it unloaded and with the safety lock on.  Bullets should be stored separately.  Helmet use- Always wear a helmet when riding a motorcycle, bicycle, rollerblading or skateboarding.  Safe sex- If you may be exposed to a sexually transmitted infection, use a condom  Seat belts- Seat bels can save your life; always wear one.  Smoke/Carbon Monoxide detectors- These detectors need to be installed on the appropriate level of your home.  Replace batteries at least once a year.  Skin Cancer- When out in the sun, cover up and use sunscreen SPF 15 or higher.  Violence- If anyone is threatening or hurting you, please tell your healthcare provider.  I will contact you with your lab results as soon as they are available.   If you have not heard from me in 2 weeks, please contact me.  The fastest way to get your results is to register for My Chart (see the instructions on the last page of this printout).

## 2014-11-21 ENCOUNTER — Encounter: Payer: Self-pay | Admitting: Physician Assistant

## 2015-12-01 ENCOUNTER — Ambulatory Visit (INDEPENDENT_AMBULATORY_CARE_PROVIDER_SITE_OTHER): Payer: 59 | Admitting: Physician Assistant

## 2015-12-01 VITALS — BP 98/76 | HR 58 | Temp 97.9°F | Resp 16 | Ht 67.0 in | Wt 160.0 lb

## 2015-12-01 DIAGNOSIS — Z6827 Body mass index (BMI) 27.0-27.9, adult: Secondary | ICD-10-CM | POA: Insufficient documentation

## 2015-12-01 DIAGNOSIS — Z6825 Body mass index (BMI) 25.0-25.9, adult: Secondary | ICD-10-CM | POA: Insufficient documentation

## 2015-12-01 DIAGNOSIS — E786 Lipoprotein deficiency: Secondary | ICD-10-CM | POA: Insufficient documentation

## 2015-12-01 DIAGNOSIS — Z0000 Encounter for general adult medical examination without abnormal findings: Secondary | ICD-10-CM

## 2015-12-01 DIAGNOSIS — Z1389 Encounter for screening for other disorder: Secondary | ICD-10-CM

## 2015-12-01 DIAGNOSIS — Z23 Encounter for immunization: Secondary | ICD-10-CM

## 2015-12-01 DIAGNOSIS — K120 Recurrent oral aphthae: Secondary | ICD-10-CM

## 2015-12-01 LAB — POCT URINALYSIS DIP (MANUAL ENTRY)
Bilirubin, UA: NEGATIVE
Blood, UA: NEGATIVE
Glucose, UA: NEGATIVE
Ketones, POC UA: NEGATIVE
Leukocytes, UA: NEGATIVE
Nitrite, UA: NEGATIVE
Protein Ur, POC: NEGATIVE
Spec Grav, UA: 1.015
Urobilinogen, UA: 0.2
pH, UA: 7.5

## 2015-12-01 LAB — LIPID PANEL
Cholesterol: 200 mg/dL (ref 125–200)
HDL: 36 mg/dL — ABNORMAL LOW (ref >=40)
LDL Cholesterol: 149 mg/dL — ABNORMAL HIGH (ref <130)
Total CHOL/HDL Ratio: 5.6 Ratio — ABNORMAL HIGH (ref <=5.0)
Triglycerides: 75 mg/dL (ref <150)
VLDL: 15 mg/dL (ref <30)

## 2015-12-01 LAB — POC MICROSCOPIC URINALYSIS (UMFC): Mucus: ABSENT

## 2015-12-01 NOTE — Patient Instructions (Addendum)
   IF you received an x-ray today, you will receive an invoice from [facility]. Please contact [facility] at [phone] with questions or concerns regarding your invoice.   IF you received labwork today, you will receive an invoice from [facility]. Please contact [facility] at [phone] with questions or concerns regarding your invoice.   Our billing staff will not be able to assist you with questions regarding bills from these companies.  You will be contacted with the lab results as soon as they are available. The fastest way to get your results is to activate your My Chart account. Instructions are located on the last page of this paperwork. If you have not heard from us regarding the results in 2 weeks, please contact this office.    Keeping you healthy  Get these tests  Blood pressure- Have your blood pressure checked once a year by your healthcare provider.  Normal blood pressure is 120/80.  Weight- Have your body mass index (BMI) calculated to screen for obesity.  BMI is a measure of body fat based on height and weight. You can also calculate your own BMI at www.nhlbisupport.com/bmi/.  Cholesterol- Have your cholesterol checked regularly starting at age 35, sooner may be necessary if you have diabetes, high blood pressure, if a family member developed heart diseases at an early age or if you smoke.   Chlamydia, HIV, and other sexual transmitted disease- Get screened each year until the age of 25 then within three months of each new sexual partner.  Diabetes- Have your blood sugar checked regularly if you have high blood pressure, high cholesterol, a family history of diabetes or if you are overweight.  Get these vaccines  Flu shot- Every fall.  Tetanus shot- Every 10 years.  Menactra- Single dose; prevents meningitis.  Take these steps  Don't smoke- If you do smoke, ask your healthcare provider about quitting. For tips on  how to quit, go to www.smokefree.gov or call 1-800-QUIT-NOW.  Be physically active- Exercise 5 days a week for at least 30 minutes.  If you are not already physically active start slow and gradually work up to 30 minutes of moderate physical activity.  Examples of moderate activity include walking briskly, mowing the yard, dancing, swimming bicycling, etc.  Eat a healthy diet- Eat a variety of healthy foods such as fruits, vegetables, low fat milk, low fat cheese, yogurt, lean meats, poultry, fish, beans, tofu, etc.  For more information on healthy eating, go to www.thenutritionsource.org  Drink alcohol in moderation- Limit alcohol intake two drinks or less a day.  Never drink and drive.  Dentist- Brush and floss teeth twice daily; visit your dentis twice a year.  Depression-Your emotional health is as important as your physical health.  If you're feeling down, losing interest in things you normally enjoy please talk with your healthcare provider.  Gun Safety- If you keep a gun in your home, keep it unloaded and with the safety lock on.  Bullets should be stored separately.  Helmet use- Always wear a helmet when riding a motorcycle, bicycle, rollerblading or skateboarding.  Safe sex- If you may be exposed to a sexually transmitted infection, use a condom  Seat belts- Seat bels can save your life; always wear one.  Smoke/Carbon Monoxide detectors- These detectors need to be installed on the appropriate level of your home.  Replace batteries at least once a year.  Skin Cancer- When out in the sun, cover up and use sunscreen SPF   15 or higher.  Violence- If anyone is threatening or hurting you, please tell your healthcare provider. 

## 2015-12-01 NOTE — Progress Notes (Signed)
Subjective:    Patient ID: Henry Harrington, male    DOB: 09-16-1975, 40 y.o.   MRN: [id number]  Chief Complaint  Patient presents with  . Annual Exam   HIV Screening: 11/20/2014 Seasonal Influenza Vaccination: Received today  Td/Tdap Vaccination: 11/20/2014 Frequency of Dental evaluation: Last in April/May per patient  Frequency of Eye evaluation: Last in Sept. 2017 per patient   HPI: Presents today for annual physical exam. No complaints today. Up to date on health maintenance. No significant past medical history and taking no current medications. 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 Constipation has resolved from visit last year without medication management needed, endorses 1 normal BM daily. Does admit to some fast food with multiple young children but tries to eat healthy. States he drinks 1 soda/day, water other than that. Non-smoker, no ETOH or drug use. Denies feeling down or depressed or anhedonia.  No Known Allergies  Patient Active Problem List   Diagnosis Date Noted  . BMI 25.0-25.9,adult 12/01/2015  . Low HDL (under 40) 12/01/2015  . 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 CONSTIPATION 06/09/2007    Review of Systems  Constitutional: Negative for chills, fatigue, fever and unexpected weight change.  HENT: Negative for congestion, ear discharge, ear pain, hearing loss, rhinorrhea, sore throat, tinnitus and trouble swallowing.   Eyes: Negative for pain, discharge, redness, itching and visual disturbance.  Respiratory: Negative for cough, choking, chest tightness, shortness of breath and wheezing.   Cardiovascular: Negative for chest pain, palpitations and leg swelling.  Gastrointestinal: Negative for abdominal pain, blood in stool, constipation, diarrhea, nausea and vomiting.  Genitourinary: Negative for difficulty urinating, dysuria, frequency, hematuria and urgency.  Musculoskeletal: Negative for arthralgias and myalgias.  Allergic/Immunologic: Negative for environmental  allergies and food allergies.  Neurological: Negative for dizziness, syncope, weakness and headaches.  Psychiatric/Behavioral: Negative for decreased concentration and dysphoric mood.     Depression screen [facility] 2/9 12/01/2015 11/20/2014 09/13/2014  Decreased Interest 0 0 0  Down, Depressed, Hopeless 0 0 0  PHQ - 2 Score 0 0 0      Objective:   Physical Exam General: Well-developed, well-nourished, appears stated age and in no apparent distress. HEENT:  Head: Normocephalic, atraumatic Eyes: PERRLA, sclera and conjunctiva clear without injection or icterus. Ears: Left canal clear with no lesions, tympanic membranes pearly gray, intact with visible boney landmarks and cone of light. Right canal with cerumen occluding visibility of TM. Nose: Patent. Mucosa deep pink, glistening with no discharge, masses or lesions. No septum perforations, exudates, or polyps. Throat: Mouth and throat without evidence of tonsillar hypertrophy or exudate. Aphthous ulcer noted on uvula with surrounding erythema. No cobble stoning. Neck: Supple. No thyromegaly or lymphadenopathy. No tracheal deviations or JVD. Pulmonary: Clear to auscultation bilaterally, no wheezes, rhonchi, or rales. No cyanosis or clubbing. Cardiovascular: Regular rate and rhythm with normal S1 and S2 without murmurs, rubs, or gallops. Posterior tibialis and dorsalis pedis pulses 2+ bilaterally. Abdominal: Normoactive bowel sounds. Non-tender to light and deep palpation in all four quadrants, no organomegaly, no distention. No rebound or guarding. Neurological: Awake, alert, oriented. Cranial nerves grossly intact. DTRs 2+ bilaterally at patella, biceps, and Achilles. Strength in upper and lower extremity 5/5 bilaterally. Skin: Skin warm and dry. No rashes noted. Psychiatric: Appropriate mood and affect. Fluent speech and normal behavior.   Results for orders placed or performed in visit on 12/01/15  POCT urinalysis dipstick  Result Value Ref  Range   Color, UA yellow yellow   Clarity, UA  clear clear   Glucose, UA negative negative   Bilirubin, UA negative negative   Ketones, POC UA negative negative   Spec Grav, UA 1.015    Blood, UA negative negative   pH, UA 7.5    Protein Ur, POC negative negative   Urobilinogen, UA 0.2    Nitrite, UA Negative Negative   Leukocytes, UA Negative Negative  POCT Microscopic Urinalysis ([facility])  Result Value Ref Range   WBC,UR,HPF,POC None None WBC/hpf   RBC,UR,HPF,POC None None RBC/hpf   Bacteria None None, Too numerous to count   Mucus Absent Absent   Epithelial Cells, UR Per Microscopy None None, Too numerous to count cells/hpf       Assessment & Plan:  1. Annual physical exam No current complaints, up to date on health maintenance.  2. Low HDL (under 40) 11/2014 lipid panel with low HDL and otherwise normal values. Follow-up lipid panel today to check for decreasing trend of HDL. - Lipid panel  3. Screening for blood or protein in urine - POCT urinalysis dipstick - POCT Microscopic Urinalysis ([facility])  4. Need for influenza vaccination - Flu Vaccine QUAD 36+ mos IM  Incidental aphthous ulcer noted on uvula, patient asymptomatic. Discussed with patient this is often associated with a virus and he may develop some cough, sore throat, congestion. RTC if new symptoms or other problems arise.

## 2015-12-01 NOTE — Progress Notes (Signed)
Patient ID: Henry Harrington, male    DOB: [birth year], 40 y.o.   MRN: [id number]  PCP: Porfirio Oar, PA-C  Chief Complaint  Patient presents with  . Annual Exam    Subjective:   HPI: Presents for [name].  Colorectal Cancer Screening: not yet a candidate Prostate Cancer Screening: not yet a candidate Bone Density Testing: not yet a candidate HIV Screening: complete STI Screening: very low risk Seasonal Influenza Vaccination: today Td/Tdap Vaccination: current Pneumococcal Vaccination: not yet a candidate Zoster Vaccination: not yet a candidate Frequency of Dental evaluation: Q6 months Frequency of Eye evaluation: annually (last month)      Patient Active Problem List   Diagnosis Date Noted  . BMI 25.0-25.9,adult 12/01/2015  . Low HDL (under 40) 12/01/2015    Past Medical History:  Diagnosis Date  . Constipation 06/09/2007   Qualifier: Diagnosis of  By: [name], [name]    . Gastroparesis 06/09/2007   Qualifier: Diagnosis of  By: [name], [name]       Prior to Admission medications   Not on File    No Known Allergies  No past surgical history on file.  Family History  Problem Relation Age of Onset  . Cancer Father   . Stroke Father     Social History   Social History  . Marital status: Married    Spouse name: Dejean Tribby  . Number of children: 3  . Years of education: 12th grade   Occupational History  . 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 Smoking status: Former Smoker    Packs/day: 1.00    Years: 7.00    Types: Cigarettes    Quit date: 02/10/1997  . Smokeless tobacco: Never Used  . Alcohol use No  . Drug use: No  . Sexual activity: Yes    Partners: Female   Other Topics Concern  . None   Social History Narrative   Exercise: No.    He has two daughters and one son.    Former Engineer, manufacturing          Review of Systems  Constitutional: Negative.   HENT: Negative.   Eyes: Negative.     Respiratory: Negative.   Cardiovascular: Negative.   Gastrointestinal: Negative.   Endocrine: Negative.   Genitourinary: Negative.   Musculoskeletal: Negative.   Skin: Negative.   Allergic/Immunologic: Negative.   Neurological: Negative.   Hematological: Negative.   Psychiatric/Behavioral: Negative.         Objective:  Physical Exam  Constitutional: He is oriented to person, place, and time. He appears well-developed and well-nourished. He is active and cooperative. No distress.  BP 98/76 (BP Location: Right Arm, Patient Position: Sitting, Cuff Size: Normal)   Pulse (!) 58   Temp 97.9 F (36.6 C)   Resp 16   Ht 5\' 7"  (1.702 m)   Wt 160 lb (72.6 kg)   SpO2 97%   BMI 25.06 kg/m   HENT:  Head: Normocephalic and atraumatic.  Right Ear: Hearing normal.  Left Ear: Hearing normal.  Mouth/Throat: Mucous membranes are normal. Oral lesions (ulceration of the soft palate/uvula) present. No uvula swelling. No oropharyngeal exudate, posterior oropharyngeal edema, posterior oropharyngeal erythema or tonsillar abscesses.    Eyes: Conjunctivae are normal. No scleral icterus.  Neck: Normal range of motion. Neck supple. No thyromegaly present.  Cardiovascular: Normal rate, regular rhythm and normal heart sounds.   Pulses:      Radial pulses are  2+ on the right side, and 2+ on the left side.  Pulmonary/Chest: Effort normal and breath sounds normal.  Lymphadenopathy:       Head (right side): No tonsillar, no preauricular, no posterior auricular and no occipital adenopathy present.       Head (left side): No tonsillar, no preauricular, no posterior auricular and no occipital adenopathy present.    He has no cervical adenopathy.       Right: No supraclavicular adenopathy present.       Left: No supraclavicular adenopathy present.  Neurological: He is alert and oriented to person, place, and time. No sensory deficit.  Skin: Skin is warm, dry and intact. No rash noted. No cyanosis or  erythema. Nails show no clubbing.  Psychiatric: He has a normal mood and affect. His speech is normal and behavior is normal.           Assessment & Plan:  1. Annual physical exam Age appropriate anticipatory guidance provided.  2. Low HDL (under 40) Await lab results. - Lipid panel  3. Screening for blood or protein in urine - POCT urinalysis dipstick - POCT Microscopic Urinalysis ([facility])  4. Need for influenza vaccination - Flu Vaccine QUAD 36+ mos IM  5. Asymptomatic aphthous ulcer Anticipatory guidance. May develop viral illness in the next several days. Rest. Hydrate. RTC if develops symptoms.  Fernande Brashelle S. Kindred Heying, PA-C Physician Assistant-Certified [facility]

## 2015-12-12 ENCOUNTER — Encounter: Payer: Self-pay | Admitting: Physician Assistant

## 2016-12-10 ENCOUNTER — Encounter: Payer: Self-pay | Admitting: Physician Assistant

## 2016-12-10 ENCOUNTER — Ambulatory Visit (INDEPENDENT_AMBULATORY_CARE_PROVIDER_SITE_OTHER): Payer: 59 | Admitting: Physician Assistant

## 2016-12-10 VITALS — BP 110/72 | HR 79 | Temp 98.6°F | Resp 18 | Ht 67.0 in | Wt 160.0 lb

## 2016-12-10 DIAGNOSIS — Z0000 Encounter for general adult medical examination without abnormal findings: Secondary | ICD-10-CM | POA: Diagnosis not present

## 2016-12-10 DIAGNOSIS — Z13228 Encounter for screening for other metabolic disorders: Secondary | ICD-10-CM | POA: Diagnosis not present

## 2016-12-10 DIAGNOSIS — Z23 Encounter for immunization: Secondary | ICD-10-CM

## 2016-12-10 DIAGNOSIS — Z6825 Body mass index (BMI) 25.0-25.9, adult: Secondary | ICD-10-CM

## 2016-12-10 DIAGNOSIS — E786 Lipoprotein deficiency: Secondary | ICD-10-CM

## 2016-12-10 DIAGNOSIS — Z130 Encounter for screening for diseases of the blood and blood-forming organs and certain disorders involving the immune mechanism: Secondary | ICD-10-CM

## 2016-12-10 DIAGNOSIS — Z1329 Encounter for screening for other suspected endocrine disorder: Secondary | ICD-10-CM

## 2016-12-10 DIAGNOSIS — Z1389 Encounter for screening for other disorder: Secondary | ICD-10-CM

## 2016-12-10 NOTE — Patient Instructions (Addendum)
   IF you received an x-ray today, you will receive an invoice from [facility]. Please contact [facility] at [phone] with questions or concerns regarding your invoice.   IF you received labwork today, you will receive an invoice from [facility]. Please contact [facility] at [phone] with questions or concerns regarding your invoice.   Our billing staff will not be able to assist you with questions regarding bills from these companies.  You will be contacted with the lab results as soon as they are available. The fastest way to get your results is to activate your My Chart account. Instructions are located on the last page of this paperwork. If you have not heard from us regarding the results in 2 weeks, please contact this office.     Preventive Care 40-64 Years, Male Preventive care refers to lifestyle choices and visits with your health care provider that can promote health and wellness. What does preventive care include?  A yearly physical exam. This is also called an annual well check.  Dental exams once or twice a year.  Routine eye exams. Ask your health care provider how often you should have your eyes checked.  Personal lifestyle choices, including: ? Daily care of your teeth and gums. ? Regular physical activity. ? Eating a healthy diet. ? Avoiding tobacco and drug use. ? Limiting alcohol use. ? Practicing safe sex. ? Taking low-dose aspirin every day starting at age 50. What happens during an annual well check? The services and screenings done by your health care provider during your annual well check will depend on your age, overall health, lifestyle risk factors, and family history of disease. Counseling Your health care provider may ask you questions about your:  Alcohol use.  Tobacco use.  Drug use.  Emotional well-being.  Home and relationship well-being.  Sexual activity.  Eating habits.  Work and work  environment.  Screening You may have the following tests or measurements:  Height, weight, and BMI.  Blood pressure.  Lipid and cholesterol levels. These may be checked every 5 years, or more frequently if you are over 50 years old.  Skin check.  Lung cancer screening. You may have this screening every year starting at age 55 if you have a 30-pack-year history of smoking and currently smoke or have quit within the past 15 years.  Fecal occult blood test (FOBT) of the stool. You may have this test every year starting at age 50.  Flexible sigmoidoscopy or colonoscopy. You may have a sigmoidoscopy every 5 years or a colonoscopy every 10 years starting at age 50.  Prostate cancer screening. Recommendations will vary depending on your family history and other risks.  Hepatitis C blood test.  Hepatitis B blood test.  Sexually transmitted disease (STD) testing.  Diabetes screening. This is done by checking your blood sugar (glucose) after you have not eaten for a while (fasting). You may have this done every 1-3 years.  Discuss your test results, treatment options, and if necessary, the need for more tests with your health care provider. Vaccines Your health care provider may recommend certain vaccines, such as:  Influenza vaccine. This is recommended every year.  Tetanus, diphtheria, and acellular pertussis (Tdap, Td) vaccine. You may need a Td booster every 10 years.  Varicella vaccine. You may need this if you have not been vaccinated.  Zoster vaccine. You may need this after age 60.  Measles, mumps, and rubella (MMR) vaccine. You may need at least one dose of   MMR if you were born in 1957 or later. You may also need a second dose.  Pneumococcal 13-valent conjugate (PCV13) vaccine. You may need this if you have certain conditions and have not been vaccinated.  Pneumococcal polysaccharide (PPSV23) vaccine. You may need one or two doses if you smoke cigarettes or if you have  certain conditions.  Meningococcal vaccine. You may need this if you have certain conditions.  Hepatitis A vaccine. You may need this if you have certain conditions or if you travel or work in places where you may be exposed to hepatitis A.  Hepatitis B vaccine. You may need this if you have certain conditions or if you travel or work in places where you may be exposed to hepatitis B.  Haemophilus influenzae type b (Hib) vaccine. You may need this if you have certain risk factors.  Talk to your health care provider about which screenings and vaccines you need and how often you need them. This information is not intended to replace advice given to you by your health care provider. Make sure you discuss any questions you have with your health care provider. Document Released: 02/23/2015 Document Revised: 10/17/2015 Document Reviewed: 11/28/2014 Elsevier Interactive Patient Education  2017 [employer].

## 2016-12-10 NOTE — Progress Notes (Signed)
Patient ID: Henry Harrington, male    DOB: 07-04-1975, 41 y.o.   MRN: [id number]  PCP: Porfirio Oar, PA-C  Chief Complaint  Patient presents with  . Annual Exam    Subjective:   Presents for [name].  Colorectal Cancer Screening: not yet a candidate Prostate Cancer Screening: not yet a candidate Bone Density Testing: not yet a candidate HIV Screening: NEGATIVE 11/2014 STI Screening: very low risk Seasonal Influenza Vaccination: today Td/Tdap Vaccination: 11/20/2014 Pneumococcal Vaccination: not yet a candidate Zoster Vaccination: not yet a candidate Frequency of Dental evaluation: Q6 months Frequency of Eye evaluation: annually  No concerns or complaints today.    Patient Active Problem List   Diagnosis Date Noted  . BMI 25.0-25.9,adult 12/01/2015  . Low HDL (under 40) 12/01/2015    Past Medical History:  Diagnosis Date  . Constipation 06/09/2007   Qualifier: Diagnosis of  By: [name], [name]    . Gastroparesis 06/09/2007   Qualifier: Diagnosis of  By: [name], [name]       Prior to Admission medications   Not on File    No Known Allergies  History reviewed. No pertinent surgical history.  Family History  Problem Relation Age of Onset  . Cancer Father   . Stroke Father     Social History   Social History  . Marital status: Married    Spouse name: Jonthan Leite  . Number of children: 3  . Years of education: 12th grade   Occupational History  . 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 Smoking status: Former Smoker    Packs/day: 1.00    Years: 7.00    Types: Cigarettes    Quit date: 02/10/1997  . Smokeless tobacco: Never Used  . Alcohol use No  . Drug use: No  . Sexual activity: Yes    Partners: Female   Other Topics Concern  . None   Social History Narrative   Exercise: No.    He has two daughters and one son.    Former Engineer, manufacturing          Review of Systems  Constitutional: Negative.     HENT: Negative.   Eyes: Negative.   Respiratory: Negative.   Cardiovascular: Negative.   Gastrointestinal: Negative.   Endocrine: Negative.   Genitourinary: Negative.   Musculoskeletal: Negative.   Skin: Negative.   Allergic/Immunologic: Negative.   Neurological: Positive for light-headedness (episodic, feels like BPPV with rapid position change, 1-2 times/week x 6 months, lasts moments, can happen any time). Negative for dizziness, tremors, seizures, syncope, facial asymmetry, speech difficulty, weakness, numbness and headaches.  Hematological: Negative.   Psychiatric/Behavioral: Negative.         Objective:  Physical Exam  Constitutional: He is oriented to person, place, and time. He appears well-developed and well-nourished. He is active and cooperative.  Non-toxic appearance. He does not have a sickly appearance. He does not appear ill. No distress.  BP 110/72 (BP Location: Left Arm, Patient Position: Sitting, Cuff Size: Normal)   Pulse 79   Temp 98.6 F (37 C) (Oral)   Resp 18   Ht 5\' 7"  (1.702 m)   Wt 160 lb (72.6 kg)   SpO2 98%   BMI 25.06 kg/m    HENT:  Head: Normocephalic and atraumatic.  Right Ear: Hearing, tympanic membrane, external ear and ear canal normal.  Left Ear: Hearing, tympanic membrane, external ear and ear canal normal.  Nose: Nose normal.  Mouth/Throat: Uvula is midline, oropharynx is clear and moist and mucous membranes are normal. He does not have dentures. No oral lesions. No trismus in the jaw. Normal dentition. No dental abscesses, uvula swelling, lacerations or dental caries.  Eyes: Pupils are equal, round, and reactive to light. Conjunctivae, EOM and lids are normal. Right eye exhibits no discharge. Left eye exhibits no discharge. No scleral icterus.  Fundoscopic exam:      The right eye shows no arteriolar narrowing, no AV nicking, no exudate, no hemorrhage and no papilledema.       The left eye shows no arteriolar narrowing, no AV nicking, no  exudate, no hemorrhage and no papilledema.  Neck: Normal range of motion, full passive range of motion without pain and phonation normal. Neck supple. No spinous process tenderness and no muscular tenderness present. No neck rigidity. No tracheal deviation, no edema, no erythema and normal range of motion present. No thyromegaly present.  Cardiovascular: Normal rate, regular rhythm, S1 normal, S2 normal, normal heart sounds, intact distal pulses and normal pulses.  Exam reveals no gallop and no friction rub.   No murmur heard. Pulmonary/Chest: Effort normal and breath sounds normal. No respiratory distress. He has no wheezes. He has no rales.  Abdominal: Soft. Normal appearance and bowel sounds are normal. He exhibits no distension and no mass. There is no hepatosplenomegaly. There is no tenderness. There is no rebound and no guarding. No hernia.  Musculoskeletal: Normal range of motion. He exhibits no edema or tenderness.       Cervical back: Normal. He exhibits normal range of motion, no tenderness, no bony tenderness, no swelling, no edema, no deformity, no laceration, no pain, no spasm and normal pulse.       Thoracic back: Normal.       Lumbar back: Normal.  Lymphadenopathy:       Head (right side): No submental, no submandibular, no tonsillar, no preauricular, no posterior auricular and no occipital adenopathy present.       Head (left side): No submental, no submandibular, no tonsillar, no preauricular, no posterior auricular and no occipital adenopathy present.    He has no cervical adenopathy.       Right: No supraclavicular adenopathy present.       Left: No supraclavicular adenopathy present.  Neurological: He is alert and oriented to person, place, and time. He has normal strength and normal reflexes. He displays no tremor. No cranial nerve deficit. He exhibits normal muscle tone. Coordination and gait normal.  Skin: Skin is warm, dry and intact. No abrasion, no ecchymosis, no  laceration, no lesion and no rash noted. He is not diaphoretic. No cyanosis or erythema. No pallor. Nails show no clubbing.  Psychiatric: He has a normal mood and affect. His speech is normal and behavior is normal. Judgment and thought content normal. Cognition and memory are normal.           Assessment & Plan:   Problem List Items Addressed This Visit    BMI 25.0-25.9,adult    Encouraged healthy eating and regular exercise.      Low HDL (under 40)    Encouraged healthy eating with plenty of omega 3 fatty acids, fruits and veggies. Increased physical activity.      Relevant Orders   Lipid panel (Completed)    Other Visit Diagnoses    Annual physical exam    -  Primary   Age appropriate health guidance provided.   Flu vaccine need  Relevant Orders   Flu Vaccine QUAD 36+ mos IM (Completed)   Screening for blood or protein in urine       Relevant Orders   Urinalysis, dipstick only (Completed)   Screening for metabolic disorder       Relevant Orders   Comprehensive metabolic panel (Completed)   Screening for thyroid disorder       Relevant Orders   TSH (Completed)   Screening for deficiency anemia       Relevant Orders   CBC with Differential/Platelet (Completed)       Return in about 1 year (around 12/10/2017), or if symptoms worsen or fail to improve, for Wellness Exam.   Fernande Bras, PA-C Primary Care at [facility]

## 2016-12-11 LAB — TSH: TSH: 2.49 u[IU]/mL (ref 0.450–4.500)

## 2016-12-11 LAB — COMPREHENSIVE METABOLIC PANEL
ALT: 25 IU/L (ref 0–44)
AST: 21 IU/L (ref 0–40)
Albumin/Globulin Ratio: 1.9 (ref 1.2–2.2)
Albumin: 4.4 g/dL (ref 3.5–5.5)
Alkaline Phosphatase: 71 IU/L (ref 39–117)
BUN/Creatinine Ratio: 10 (ref 9–20)
BUN: 10 mg/dL (ref 6–20)
Bilirubin Total: 0.4 mg/dL (ref 0.0–1.2)
CO2: 24 mmol/L (ref 20–29)
Calcium: 9.4 mg/dL (ref 8.7–10.2)
Chloride: 102 mmol/L (ref 96–106)
Creatinine, Ser: 0.99 mg/dL (ref 0.76–1.27)
GFR calc Af Amer: 110 mL/min/{1.73_m2} (ref >59)
GFR calc non Af Amer: 96 mL/min/{1.73_m2} (ref >59)
Globulin, Total: 2.3 g/dL (ref 1.5–4.5)
Glucose: 92 mg/dL (ref 65–99)
Potassium: 4.1 mmol/L (ref 3.5–5.2)
Sodium: 139 mmol/L (ref 134–144)
Total Protein: 6.7 g/dL (ref 6.0–8.5)

## 2016-12-11 LAB — LIPID PANEL
Chol/HDL Ratio: 5.1 ratio — ABNORMAL HIGH (ref 0.0–5.0)
Cholesterol, Total: 179 mg/dL (ref 100–199)
HDL: 35 mg/dL — ABNORMAL LOW (ref >39)
LDL Calculated: 134 mg/dL — ABNORMAL HIGH (ref 0–99)
Triglycerides: 52 mg/dL (ref 0–149)
VLDL Cholesterol Cal: 10 mg/dL (ref 5–40)

## 2016-12-11 LAB — CBC WITH DIFFERENTIAL/PLATELET
Basophils Absolute: 0 10*3/uL (ref 0.0–0.2)
Basos: 0 %
EOS (ABSOLUTE): 0 10*3/uL (ref 0.0–0.4)
Eos: 0 %
Hematocrit: 43.5 % (ref 37.5–51.0)
Hemoglobin: 14.4 g/dL (ref 13.0–17.7)
Immature Grans (Abs): 0 10*3/uL (ref 0.0–0.1)
Immature Granulocytes: 0 %
Lymphocytes Absolute: 1.2 10*3/uL (ref 0.7–3.1)
Lymphs: 22 %
MCH: 29.8 pg (ref 26.6–33.0)
MCHC: 33.1 g/dL (ref 31.5–35.7)
MCV: 90 fL (ref 79–97)
Monocytes Absolute: 0.4 10*3/uL (ref 0.1–0.9)
Monocytes: 7 %
Neutrophils Absolute: 4 10*3/uL (ref 1.4–7.0)
Neutrophils: 71 %
Platelets: 264 10*3/uL (ref 150–379)
RBC: 4.83 x10E6/uL (ref 4.14–5.80)
RDW: 13.3 % (ref 12.3–15.4)
WBC: 5.7 10*3/uL (ref 3.4–10.8)

## 2016-12-11 LAB — URINALYSIS, DIPSTICK ONLY
Bilirubin, UA: NEGATIVE
Glucose, UA: NEGATIVE
Ketones, UA: NEGATIVE
Leukocytes, UA: NEGATIVE
Nitrite, UA: NEGATIVE
Protein, UA: NEGATIVE
RBC, UA: NEGATIVE
Specific Gravity, UA: 1.023 (ref 1.005–1.030)
Urobilinogen, Ur: 0.2 mg/dL (ref 0.2–1.0)
pH, UA: 5.5 (ref 5.0–7.5)

## 2016-12-12 ENCOUNTER — Encounter: Payer: Self-pay | Admitting: Physician Assistant

## 2016-12-28 NOTE — Assessment & Plan Note (Signed)
Encouraged healthy eating with plenty of omega 3 fatty acids, fruits and veggies. Increased physical activity.

## 2016-12-28 NOTE — Assessment & Plan Note (Signed)
Encouraged healthy eating and regular exercise. 

## 2017-05-14 ENCOUNTER — Encounter: Payer: Self-pay | Admitting: Physician Assistant

## 2017-12-18 ENCOUNTER — Encounter: Payer: Self-pay | Admitting: Physician Assistant

## 2017-12-18 ENCOUNTER — Ambulatory Visit: Payer: Self-pay | Admitting: Physician Assistant

## 2017-12-18 ENCOUNTER — Other Ambulatory Visit: Payer: Self-pay

## 2017-12-18 VITALS — BP 116/77 | HR 64 | Temp 98.7°F | Resp 18 | Ht 67.32 in | Wt 164.0 lb

## 2017-12-18 DIAGNOSIS — Z130 Encounter for screening for diseases of the blood and blood-forming organs and certain disorders involving the immune mechanism: Secondary | ICD-10-CM

## 2017-12-18 DIAGNOSIS — Z23 Encounter for immunization: Secondary | ICD-10-CM

## 2017-12-18 DIAGNOSIS — Z0000 Encounter for general adult medical examination without abnormal findings: Secondary | ICD-10-CM

## 2017-12-18 DIAGNOSIS — Z13228 Encounter for screening for other metabolic disorders: Secondary | ICD-10-CM

## 2017-12-18 DIAGNOSIS — Z1389 Encounter for screening for other disorder: Secondary | ICD-10-CM

## 2017-12-18 DIAGNOSIS — E7800 Pure hypercholesterolemia, unspecified: Secondary | ICD-10-CM

## 2017-12-18 LAB — CMP14+EGFR
ALT: 32 IU/L (ref 0–44)
AST: 21 IU/L (ref 0–40)
Albumin/Globulin Ratio: 2.1 (ref 1.2–2.2)
Albumin: 4.8 g/dL (ref 3.5–5.5)
Alkaline Phosphatase: 75 IU/L (ref 39–117)
BUN/Creatinine Ratio: 14 (ref 9–20)
BUN: 13 mg/dL (ref 6–24)
Bilirubin Total: 0.4 mg/dL (ref 0.0–1.2)
CO2: 25 mmol/L (ref 20–29)
Calcium: 9.7 mg/dL (ref 8.7–10.2)
Chloride: 100 mmol/L (ref 96–106)
Creatinine, Ser: 0.92 mg/dL (ref 0.76–1.27)
GFR calc Af Amer: 120 mL/min/{1.73_m2} (ref >59)
GFR calc non Af Amer: 104 mL/min/{1.73_m2} (ref >59)
Globulin, Total: 2.3 g/dL (ref 1.5–4.5)
Glucose: 93 mg/dL (ref 65–99)
Potassium: 4.4 mmol/L (ref 3.5–5.2)
Sodium: 138 mmol/L (ref 134–144)
Total Protein: 7.1 g/dL (ref 6.0–8.5)

## 2017-12-18 LAB — CBC WITH DIFFERENTIAL/PLATELET
Basophils Absolute: 0 10*3/uL (ref 0.0–0.2)
Basos: 1 %
EOS (ABSOLUTE): 0 10*3/uL (ref 0.0–0.4)
Eos: 0 %
Hematocrit: 45.1 % (ref 37.5–51.0)
Hemoglobin: 15.3 g/dL (ref 13.0–17.7)
Immature Grans (Abs): 0 10*3/uL (ref 0.0–0.1)
Immature Granulocytes: 0 %
Lymphocytes Absolute: 1.3 10*3/uL (ref 0.7–3.1)
Lymphs: 22 %
MCH: 30.1 pg (ref 26.6–33.0)
MCHC: 33.9 g/dL (ref 31.5–35.7)
MCV: 89 fL (ref 79–97)
Monocytes Absolute: 0.5 10*3/uL (ref 0.1–0.9)
Monocytes: 9 %
Neutrophils Absolute: 3.9 10*3/uL (ref 1.4–7.0)
Neutrophils: 68 %
Platelets: 285 10*3/uL (ref 150–450)
RBC: 5.08 x10E6/uL (ref 4.14–5.80)
RDW: 12.6 % (ref 12.3–15.4)
WBC: 5.8 10*3/uL (ref 3.4–10.8)

## 2017-12-18 LAB — LIPID PANEL
Chol/HDL Ratio: 5.7 ratio — ABNORMAL HIGH (ref 0.0–5.0)
Cholesterol, Total: 215 mg/dL — ABNORMAL HIGH (ref 100–199)
HDL: 38 mg/dL — ABNORMAL LOW (ref >39)
LDL Calculated: 156 mg/dL — ABNORMAL HIGH (ref 0–99)
Triglycerides: 105 mg/dL (ref 0–149)
VLDL Cholesterol Cal: 21 mg/dL (ref 5–40)

## 2017-12-18 NOTE — Progress Notes (Signed)
Henry Harrington  MRN: [id number] DOB: [birth year]  Subjective:  Pt is a 42 y.o. male who presents for annual physical exam. Pt is fasting today.   Primary Preventative Screenings: Family Planning: 3 children, does not plan on having anymore STI screening: Sexually active with monogamous wife, no concern for STDs Breast Cancer: no FH of breast cancer Colorectal Cancer: no FH of colon cancer Bone Density: n/a Weight/Blood sugar/Diet/Exercise: Eating "okay", drinks water and soda eats a good amount of dairy. Exercise includes chasing kids,.  OTC/Vit/Supp/Herbal: Daily multivitamin Dentist/Optho: annually Immunizations: Tdap:2016 Flu: 2018  Patient Active Problem List   Diagnosis Date Noted  . BMI 25.0-25.9,adult 12/01/2015  . Low HDL (under 40) 12/01/2015    No current outpatient medications on file prior to visit.   No current facility-administered medications on file prior to visit.     No Known Allergies  Social History   Socioeconomic History  . Marital status: Married    Spouse name: Ansley Stanwood  . Number of children: 3  . Years of education: 12th grade  . Highest education level: Not on file  Occupational History  . 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 Financial resource strain: Not hard at all  . Food insecurity:    Worry: Never true    Inability: Never true  . Transportation needs:    Medical: No    Non-medical: No  Tobacco Use  . Smoking status: Former Smoker    Packs/day: 1.00    Years: 7.00    Pack years: 7.00    Types: Cigarettes    Last attempt to quit: 02/10/1997    Years since quitting: 20.8  . Smokeless tobacco: Never Used  Substance and Sexual Activity  . Alcohol use: No    Alcohol/week: 0.0 standard drinks  . Drug use: No  . Sexual activity: Yes    Partners: Female  Lifestyle  . Physical activity:    Days per week: 0 days    Minutes per session: 0 min  . Stress: To some extent  Relationships  . Social connections:    Talks on  phone: More than three times a week    Gets together: Once a week    Attends religious service: More than 4 times per year    Active member of club or organization: Yes    Attends meetings of clubs or organizations: More than 4 times per year    Relationship status: Married  Other Topics Concern  . Not on file  Social History Narrative   He has two daughters and one son.    Former Scientist, research (medical)    No past surgical history on file.  Family History  Problem Relation Age of Onset  . Cancer Father   . Stroke Father     Review of Systems  Constitutional: Negative for activity change, appetite change, chills, diaphoresis, fatigue, fever and unexpected weight change.  HENT: Negative for congestion, dental problem, drooling, ear discharge, ear pain, facial swelling, hearing loss, mouth sores, nosebleeds, postnasal drip, rhinorrhea, sinus pressure, sinus pain, sneezing, sore throat, tinnitus, trouble swallowing and voice change.   Eyes: Negative for photophobia, pain, discharge, redness, itching and visual disturbance.  Respiratory: Negative for apnea, cough, choking, chest tightness, shortness of breath, wheezing and stridor.   Cardiovascular: Negative for chest pain, palpitations and leg swelling.  Gastrointestinal: Negative for abdominal distention, abdominal pain, anal bleeding, blood in stool, constipation, diarrhea, nausea, rectal pain and vomiting.  Endocrine: Negative for cold  intolerance, heat intolerance, polydipsia, polyphagia and polyuria.  Genitourinary: Negative for decreased urine volume, difficulty urinating, discharge, dysuria, enuresis, flank pain, frequency, genital sores, hematuria, penile pain, penile swelling, scrotal swelling, testicular pain and urgency.  Musculoskeletal: Negative for arthralgias, back pain, gait problem, joint swelling, myalgias, neck pain and neck stiffness.  Skin: Negative for color change, pallor, rash and wound.  Allergic/Immunologic:  Negative for environmental allergies, food allergies and immunocompromised state.  Neurological: Negative for dizziness, tremors, seizures, syncope, facial asymmetry, speech difficulty, weakness, light-headedness, numbness and headaches.  Hematological: Negative for adenopathy. Does not bruise/bleed easily.  Psychiatric/Behavioral: Negative for agitation, behavioral problems, confusion, decreased concentration, dysphoric mood, hallucinations, self-injury, sleep disturbance and suicidal ideas. The patient is not nervous/anxious and is not hyperactive.     Objective:  BP 116/77   Pulse 64   Temp 98.7 F (37.1 C) (Oral)   Resp 18   Ht 5' 7.32" (1.71 m)   Wt 164 lb (74.4 kg)   SpO2 98%   BMI 25.44 kg/m   Physical Exam  Constitutional: He is oriented to person, place, and time. He appears well-developed and well-nourished. No distress.  HENT:  Head: Normocephalic and atraumatic.  Right Ear: Hearing, tympanic membrane, external ear and ear canal normal.  Left Ear: Hearing, tympanic membrane, external ear and ear canal normal.  Nose: Nose normal.  Mouth/Throat: Uvula is midline, oropharynx is clear and moist and mucous membranes are normal. No oropharyngeal exudate.  Eyes: Pupils are equal, round, and reactive to light. Conjunctivae and EOM are normal.  Neck: Trachea normal and normal range of motion.  Cardiovascular: Normal rate, regular rhythm, normal heart sounds and intact distal pulses.  Pulmonary/Chest: Effort normal and breath sounds normal.  Abdominal: Soft. Normal appearance and bowel sounds are normal.  Musculoskeletal: Normal range of motion.  Lymphadenopathy:       Head (right side): No submental, no submandibular, no tonsillar, no preauricular, no posterior auricular and no occipital adenopathy present.       Head (left side): No submental, no submandibular, no tonsillar, no preauricular, no posterior auricular and no occipital adenopathy present.    He has no cervical  adenopathy.       Right: No supraclavicular adenopathy present.       Left: No supraclavicular adenopathy present.  Neurological: He is alert and oriented to person, place, and time. He has normal strength and normal reflexes.  Skin: Skin is warm and dry.  Vitals reviewed.   Visual Acuity Screening   Right eye Left eye Both eyes  Without correction:     With correction: 20/25 20/15 20/20    Assessment and Plan :  Discussed healthy lifestyle, diet, exercise, preventative care, vaccinations, and addressed patient's concerns. Plan for follow up in one year. Otherwise, plan for specific conditions below.  1. Annual physical exam Await lab results.  2. Screening for blood or protein in urine - Urinalysis, dipstick only  3. Screening for metabolic disorder - [id number]  4. Screening for deficiency anemia - CBC with Differential/Platelet  5. Elevated LDL cholesterol level - Lipid panel  6. Flu vaccine need - Flu Vaccine QUAD 36+ mos IM  Tenna Delaine, PA-C  Primary Care at [facility] 12/18/2017 12:01 PM

## 2017-12-18 NOTE — Patient Instructions (Addendum)

## 2017-12-19 LAB — URINALYSIS, DIPSTICK ONLY
Bilirubin, UA: NEGATIVE
Glucose, UA: NEGATIVE
Ketones, UA: NEGATIVE
Leukocytes, UA: NEGATIVE
Nitrite, UA: NEGATIVE
Protein, UA: NEGATIVE
RBC, UA: NEGATIVE
Specific Gravity, UA: 1.021 (ref 1.005–1.030)
Urobilinogen, Ur: 0.2 mg/dL (ref 0.2–1.0)
pH, UA: 7 (ref 5.0–7.5)

## 2017-12-28 ENCOUNTER — Encounter: Payer: Self-pay | Admitting: *Deleted

## 2018-02-02 ENCOUNTER — Ambulatory Visit
Admission: EM | Admit: 2018-02-02 | Discharge: 2018-02-02 | Disposition: A | Discharge status: Home / Self Care | Payer: Self-pay | Attending: Physician Assistant | Admitting: Physician Assistant

## 2018-02-02 DIAGNOSIS — L03116 Cellulitis of left lower limb: Secondary | ICD-10-CM | POA: Insufficient documentation

## 2018-02-02 MED ORDER — CEPHALEXIN 500 MG PO CAPS: 500.0000 mg | ORAL_CAPSULE | Freq: Four times a day (QID) | ORAL | 0 refills | Status: DC

## 2018-02-02 NOTE — ED Triage Notes (Signed)
Pt c/o abscess to back of lt calf, redness noted with heat on tough

## 2018-02-02 NOTE — Discharge Instructions (Signed)
Start Keflex as directed. Warm compress to the area. Monitor for spreading redness, increased warmth, fever, follow up for reevaluation needed.

## 2018-02-02 NOTE — ED Provider Notes (Signed)
[facility]    CSN: [id number] Arrival date & time: 02/02/18  1126     History   Chief Complaint Chief Complaint  Patient presents with  . Abscess    HPI Henry Harrington is a 42 y.o. male.   42 year old male comes in for possible abscess to the left calf. States unsure how it started, but felt swelling to the area and then saw the wound and erythema a few days ago. Denies spreading, but with warmth and tenderness to palpation. Denies fever, chills, night sweats. States tried to squeeze the area with bloody drainage. Has also applied antibiotic ointment.      Past Medical History:  Diagnosis Date  . Constipation 06/09/2007   Qualifier: Diagnosis of  By: Dorian PodPeterman NCMA, [name]    . Gastroparesis 06/09/2007   Qualifier: Diagnosis of  By: Joselyn GlassmanPeterman NCMA, [name]      Patient Active Problem List   Diagnosis Date Noted  . BMI 25.0-25.9,adult 12/01/2015  . Low HDL (under 40) 12/01/2015    History reviewed. No pertinent surgical history.     Home Medications    Prior to Admission medications   Medication Sig Start Date End Date Taking? Authorizing Provider  cephALEXin (KEFLEX) 500 MG capsule Take 1 capsule (500 mg total) by mouth 4 (four) times daily. 02/02/18   Belinda FisherYu, Alaisa Moffitt V, PA-C    Family History Family History  Problem Relation Age of Onset  . Cancer Father   . Stroke Father     Social History Social History   Tobacco Use  . Smoking status: Former Smoker    Packs/day: 1.00    Years: 7.00    Pack years: 7.00    Types: Cigarettes    Last attempt to quit: 02/10/1997    Years since quitting: 20.9  . Smokeless tobacco: Never Used  Substance Use Topics  . Alcohol use: No    Alcohol/week: 0.0 standard drinks  . Drug use: No     Allergies   Patient has no known allergies.   Review of Systems Review of Systems  Reason unable to perform ROS: See HPI as above.     Physical Exam Triage Vital Signs ED Triage Vitals  Enc Vitals Group     BP 02/02/18  1136 137/80     Pulse Rate 02/02/18 1136 90     Resp 02/02/18 1136 18     Temp 02/02/18 1136 98 F (36.7 C)     Temp Source 02/02/18 1136 Oral     SpO2 02/02/18 1136 97 %     Weight --      Height --      Head Circumference --      Peak Flow --      Pain Score 02/02/18 1137 0     Pain Loc --      Pain Edu? --      Excl. in GC? --    No data found.  Updated Vital Signs BP 137/80 (BP Location: Left Arm)   Pulse 90   Temp 98 F (36.7 C) (Oral)   Resp 18   SpO2 97%   Physical Exam Constitutional:      General: He is not in acute distress.    Appearance: He is well-developed. He is not diaphoretic.  HENT:     Head: Normocephalic and atraumatic.  Eyes:     Conjunctiva/sclera: Conjunctivae normal.     Pupils: Pupils are equal, round, and reactive to light.  Skin:  General: Skin is warm and dry.     Comments: See picture below. Small wound with surrounding erythema of 3cm x 3cm. Area warm with mild tenderness to palpation. No fluctuance, induration.   Neurological:     Mental Status: He is alert and oriented to person, place, and time.        UC Treatments / Results  Labs (all labs ordered are listed, but only abnormal results are displayed) Labs Reviewed - No data to display  EKG None  Radiology No results found.  Procedures Procedures (including critical care time)  Medications Ordered in UC Medications - No data to display  Initial Impression / Assessment and Plan / UC Course  I have reviewed the triage vital signs and the nursing notes.  Pertinent labs & imaging results that were available during my care of the patient were reviewed by me and considered in my medical decision making (see chart for details).    Keflex as directed. Warm compress. Return precautions given. Patient expresses understanding and agrees to plan.  Final Clinical Impressions(s) / UC Diagnoses   Final diagnoses:  Cellulitis of leg, left    ED Prescriptions     Medication Sig Dispense Auth. Provider   cephALEXin (KEFLEX) 500 MG capsule Take 1 capsule (500 mg total) by mouth 4 (four) times daily. 28 capsule Threasa AlphaYu, Allene Furuya V, PA-C        Monte Bronder V, [place] JerseyPA-C 02/02/18 1230

## 2019-01-10 ENCOUNTER — Ambulatory Visit (INDEPENDENT_AMBULATORY_CARE_PROVIDER_SITE_OTHER): Payer: Self-pay | Admitting: Family Medicine

## 2019-01-10 ENCOUNTER — Encounter: Payer: Self-pay | Admitting: Family Medicine

## 2019-01-10 ENCOUNTER — Other Ambulatory Visit: Payer: Self-pay

## 2019-01-10 VITALS — BP 126/81 | HR 86 | Temp 98.1°F | Resp 17 | Ht 67.32 in | Wt 172.2 lb

## 2019-01-10 DIAGNOSIS — E785 Hyperlipidemia, unspecified: Secondary | ICD-10-CM

## 2019-01-10 DIAGNOSIS — Z0001 Encounter for general adult medical examination with abnormal findings: Secondary | ICD-10-CM

## 2019-01-10 DIAGNOSIS — Z23 Encounter for immunization: Secondary | ICD-10-CM

## 2019-01-10 DIAGNOSIS — Z1389 Encounter for screening for other disorder: Secondary | ICD-10-CM

## 2019-01-10 DIAGNOSIS — Z0000 Encounter for general adult medical examination without abnormal findings: Secondary | ICD-10-CM

## 2019-01-10 NOTE — Progress Notes (Signed)
Chief Complaint  Patient presents with   Annual Exam    cpe    Subjective:  Henry Harrington is a 43 y.o. male here for a health maintenance visit.  Patient is established pt  Patient Active Problem List   Diagnosis Date Noted   BMI 25.0-25.9,adult 12/01/2015   Low HDL (under 40) 12/01/2015    Past Medical History:  Diagnosis Date   Constipation 06/09/2007   Qualifier: Diagnosis of  By: Laney Potash, [name]     Gastroparesis 06/09/2007   Qualifier: Diagnosis of  By: Laney Potash, [name]      No past surgical history on file.   Outpatient Medications Prior to Visit  Medication Sig Dispense Refill   cephALEXin (KEFLEX) 500 MG capsule Take 1 capsule (500 mg total) by mouth 4 (four) times daily. (Patient not taking: Reported on 01/10/2019) 28 capsule 0   No facility-administered medications prior to visit.     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  Social Designer, fashion/clothing strain: Not hard at all   Food insecurity    Worry: Never true    Inability: Never true   Transportation needs    Medical: No    Non-medical: No  Tobacco Use   Smoking status: Former Smoker    Packs/day: 1.00    Years: 7.00    Pack years: 7.00    Types: Cigarettes    Quit date: 02/10/1997    Years since quitting: 21.9   Smokeless tobacco: Never Used  Substance and Sexual Activity   Alcohol use: No    Alcohol/week: 0.0 standard drinks   Drug use: No   Sexual activity: Yes    Partners: Female  Lifestyle   Physical activity    Days per week: 0 days    Minutes per session: 0 min   Stress:  To some extent  Relationships   Social connections    Talks on phone: More than three times a week    Gets together: Once a week    Attends religious service: More than 4 times per year    Active member of club or organization: Yes    Attends meetings of clubs or organizations: More than 4 times per year    Relationship status: Married   Intimate partner violence    Fear of current or ex partner: No    Emotionally abused: No    Physically abused: No    Forced sexual activity: No  Other Topics Concern   Not on file  Social History Narrative   He has two daughters and one son.    Former Scientist, research (medical)   Social History   Substance and Sexual Activity  Alcohol Use No   Alcohol/week: 0.0 standard drinks   Social History   Tobacco Use  Smoking Status Former Smoker   Packs/day: 1.00   Years: 7.00   Pack years: 7.00   Types: Cigarettes   Quit date: 02/10/1997   Years since quitting: 21.9  Smokeless Tobacco Never Used  Social History   Substance and Sexual Activity  Drug Use No     Health Maintenance: See under health Maintenance activity for review of completion dates as well. Immunization History  Administered Date(s) Administered   Influenza,inj,Quad PF,6+ Mos 11/20/2014, 12/01/2015, 12/10/2016, 12/18/2017   Tdap 11/20/2014      Depression Screen-PHQ2/9 [facility] screen [facility] 01/10/2019 12/18/2017 12/10/2016 12/01/2015 11/20/2014  Decreased Interest 0 0 0 0 0  Down, Depressed, Hopeless 0 0 0 0 0  PHQ - 2 Score 0 0 0 0 0       Depression Severity and Treatment Recommendations:  0-4= None  5-9= Mild / Treatment: Support, educate to call if worse; return in one month  10-14= Moderate / Treatment: Support, watchful waiting; Antidepressant or Psycotherapy  15-19= Moderately severe / Treatment: Antidepressant OR Psychotherapy  >= 20 = Major depression, severe / Antidepressant AND Psychotherapy    Review of Systems   ROS  See HPI  for ROS as well.   Review of Systems  Constitutional: Negative for activity change, appetite change, chills and fever.  HENT: Negative for congestion, nosebleeds, trouble swallowing and voice change.   Respiratory: Negative for cough, shortness of breath and wheezing.   Gastrointestinal: Negative for diarrhea, nausea and vomiting.  Genitourinary: Negative for difficulty urinating, dysuria, flank pain and hematuria.  Musculoskeletal: Negative for back pain, joint swelling and neck pain.  Neurological: Negative for dizziness, speech difficulty, light-headedness and numbness.  See HPI. All other review of systems negative.    Objective:   Vitals:   01/10/19 1010  BP: 126/81  Pulse: 86  Resp: 17  Temp: 98.1 F (36.7 C)  TempSrc: Oral  SpO2: 95%  Weight: 172 lb 3.2 oz (78.1 kg)  Height: 5' 7.32" (1.71 m)    Body mass index is 26.71 kg/m. Wt Readings from Last 3 Encounters:  01/10/19 172 lb 3.2 oz (78.1 kg)  12/18/17 164 lb (74.4 kg)  12/10/16 160 lb (72.6 kg)    Physical Exam  BP 126/81 (BP Location: Right Arm, Patient Position: Sitting, Cuff Size: Normal)    Pulse 86    Temp 98.1 F (36.7 C) (Oral)    Resp 17    Ht 5' 7.32" (1.71 m)    Wt 172 lb 3.2 oz (78.1 kg)    SpO2 95%    BMI 26.71 kg/m   General Appearance:    Alert, cooperative, no distress, appears stated age  Head:    Normocephalic, without obvious abnormality, atraumatic  Eyes:    PERRL, conjunctiva/corneas clear, EOM's intact  Ears:    Normal TM's and external ear canals, both ears  Neck:   Supple, symmetrical, trachea midline, no adenopathy;       thyroid:  No enlargement/tenderness/nodules  Back:     Symmetric, no curvature, ROM normal, no CVA tenderness  Lungs:     Clear to auscultation bilaterally, respirations unlabored  Chest wall:    No tenderness or deformity  Heart:    Regular rate and rhythm, S1 and S2 normal, no murmur, rub   or gallop  Abdomen:     Soft, non-tender, bowel sounds active all four  quadrants,    no masses, no organomegaly  Extremities:   Extremities normal, atraumatic, no cyanosis or edema  Pulses:   2+ and symmetric all extremities  Skin:   Skin color, texture, turgor normal, no rashes or lesions  Lymph nodes:   Cervical, supraclavicular, and axillary nodes normal  Neurologic:   CNII-XII intact. Normal  strength, sensation and reflexes      throughout      Assessment/Plan:   Patient was seen for a health maintenance exam.  Counseled the patient on health maintenance issues. Reviewed her health mainteance schedule and ordered appropriate tests (see orders.) Counseled on regular exercise and weight management. Recommend regular eye exams and dental cleaning.   The following issues were addressed today for health maintenance:   Toren was seen today for annual exam.  Diagnoses and all orders for this visit:  Annual physical exam- Men's Health Maintenance Plan Advised monthly testicle exam Advised dental exam every six months Discussed stress management  -     Comprehensive metabolic panel  Dyslipidemia -     Lipid panel  Screening for blood or protein in urine  Need for prophylactic vaccination and inoculation against influenza -     Flu Vaccine QUAD 36+ mos IM    No follow-ups on file.    Body mass index is 26.71 kg/m.:  Discussed the patient's BMI with patient. The BMI body mass index is 26.71 kg/m.     No future appointments.  Patient Instructions    Try fish oil supplement Red Yeast Rice    If you have lab work done today you will be contacted with your lab results within the next 2 weeks.  If you have not heard from [place] then please contact us. The fastest way to get your results is to register for My Chart.   IF you received an x-ray today, you will receive an invoice from [facility]. Please contact [facility] at [phone] with questions or concerns regarding your invoice.   IF you received labwork today,  you will receive an invoice from [facility]. Please contact [facility] at [phone] with questions or concerns regarding your invoice.   Our billing staff will not be able to assist you with questions regarding bills from these companies.  You will be contacted with the lab results as soon as they are available. The fastest way to get your results is to activate your My Chart account. Instructions are located on the last page of this paperwork. If you have not heard from [place] regarding the results in 2 weeks, please contact this office.     Cholesterol Content in Foods Cholesterol is a waxy, fat-like substance that helps to carry fat in the blood. The body needs cholesterol in small amounts, but too much cholesterol can cause damage to the arteries and heart. Most people should eat less than 200 milligrams (mg) of cholesterol a day. Foods with cholesterol  Cholesterol is found in animal-based foods, such as meat, seafood, and dairy. Generally, low-fat dairy and lean meats have less cholesterol than full-fat dairy and fatty meats. The milligrams of cholesterol per serving (mg per serving) of common cholesterol-containing foods are listed below. Meat and other proteins  Egg -- one large whole egg has 186 mg.  Veal shank -- 4 oz has 141 mg.  Lean ground [place] (93% lean) -- 4 oz has 118 mg.  Fat-trimmed lamb loin -- 4 oz has 106 mg.  Lean ground beef (90% lean) -- 4 oz has 100 mg.  Lobster -- 3.5 oz has 90 mg.  Pork loin chops -- 4 oz has 86 mg.  Canned salmon -- 3.5 oz has 83 mg.  Fat-trimmed beef top loin -- 4 oz has 78 mg.  Frankfurter -- 1 frank (3.5 oz) has 77 mg.  Crab -- 3.5 oz has 71 mg.  Roasted  chicken without skin, white meat -- 4 oz has 66 mg.  Light bologna -- 2 oz has 45 mg.  Deli-cut [place] -- 2 oz has 31 mg.  Canned tuna -- 3.5 oz has 31 mg.  Tomasa Blase -- 1 oz has 29 mg.  Oysters and mussels (raw) -- 3.5 oz has 25 mg.  Mackerel -- 1 oz has 22 mg.  Trout -- 1  oz has 20 mg.  Pork sausage -- 1 link (1 oz) has 17 mg.  Salmon -- 1 oz has 16 mg.  Tilapia -- 1 oz has 14 mg. Dairy  Soft-serve ice cream --  cup (4 oz) has 103 mg.  Whole-milk yogurt -- 1 cup (8 oz) has 29 mg.  Cheddar cheese -- 1 oz has 28 mg.  American cheese -- 1 oz has 28 mg.  Whole milk -- 1 cup (8 oz) has 23 mg.  2% milk -- 1 cup (8 oz) has 18 mg.  Cream cheese -- 1 tablespoon (Tbsp) has 15 mg.  Cottage cheese --  cup (4 oz) has 14 mg.  Low-fat (1%) milk -- 1 cup (8 oz) has 10 mg.  Sour cream -- 1 Tbsp has 8.5 mg.  Low-fat yogurt -- 1 cup (8 oz) has 8 mg.  Nonfat Greek yogurt -- 1 cup (8 oz) has 7 mg.  Half-and-half cream -- 1 Tbsp has 5 mg. Fats and oils  Cod liver oil -- 1 tablespoon (Tbsp) has 82 mg.  Butter -- 1 Tbsp has 15 mg.  Lard -- 1 Tbsp has 14 mg.  Bacon grease -- 1 Tbsp has 14 mg.  Mayonnaise -- 1 Tbsp has 5-10 mg.  Margarine -- 1 Tbsp has 3-10 mg. Exact amounts of cholesterol in these foods may vary depending on specific ingredients and brands. Foods without cholesterol Most plant-based foods do not have cholesterol unless you combine them with a food that has cholesterol. Foods without cholesterol include:  Grains and cereals.  Vegetables.  Fruits.  Vegetable oils, such as olive, canola, and sunflower oil.  Legumes, such as peas, beans, and lentils.  Nuts and seeds.  Egg whites. Summary  The body needs cholesterol in small amounts, but too much cholesterol can cause damage to the arteries and heart.  Most people should eat less than 200 milligrams (mg) of cholesterol a day. This information is not intended to replace advice given to you by your health care provider. Make sure you discuss any questions you have with your health care provider. Document Released: 09/23/2016 Document Revised: 01/09/2017 Document Reviewed: 09/23/2016 Elsevier Patient Education  2020 [employer].

## 2019-01-10 NOTE — Patient Instructions (Addendum)
Try fish oil supplement Red Yeast Rice    If you have lab work done today you will be contacted with your lab results within the next 2 weeks.  If you have not heard from [place] then please contact us. The fastest way to get your results is to register for My Chart.   IF you received an x-ray today, you will receive an invoice from [facility]. Please contact [facility] at [phone] with questions or concerns regarding your invoice.   IF you received labwork today, you will receive an invoice from [facility]. Please contact [facility] at [phone] with questions or concerns regarding your invoice.   Our billing staff will not be able to assist you with questions regarding bills from these companies.  You will be contacted with the lab results as soon as they are available. The fastest way to get your results is to activate your My Chart account. Instructions are located on the last page of this paperwork. If you have not heard from [place] regarding the results in 2 weeks, please contact this office.     Cholesterol Content in Foods Cholesterol is a waxy, fat-like substance that helps to carry fat in the blood. The body needs cholesterol in small amounts, but too much cholesterol can cause damage to the arteries and heart. Most people should eat less than 200 milligrams (mg) of cholesterol a day. Foods with cholesterol  Cholesterol is found in animal-based foods, such as meat, seafood, and dairy. Generally, low-fat dairy and lean meats have less cholesterol than full-fat dairy and fatty meats. The milligrams of cholesterol per serving (mg per serving) of common cholesterol-containing foods are listed below. Meat and other proteins  Egg - one large whole egg has 186 mg.  Veal shank - 4 oz has 141 mg.  Lean ground [place] (93% lean) - 4 oz has 118 mg.  Fat-trimmed lamb loin - 4 oz has 106 mg.  Lean ground beef (90% lean) - 4 oz has 100 mg.  Lobster - 3.5 oz has 90  mg.  Pork loin chops - 4 oz has 86 mg.  Canned salmon - 3.5 oz has 83 mg.  Fat-trimmed beef top loin - 4 oz has 78 mg.  Frankfurter - 1 frank (3.5 oz) has 77 mg.  Crab - 3.5 oz has 71 mg.  Roasted chicken without skin, white meat - 4 oz has 66 mg.  Light bologna - 2 oz has 45 mg.  Deli-cut [place] - 2 oz has 31 mg.  Canned tuna - 3.5 oz has 31 mg.  Bacon - 1 oz has 29 mg.  Oysters and mussels (raw) - 3.5 oz has 25 mg.  Mackerel - 1 oz has 22 mg.  Trout - 1 oz has 20 mg.  Pork sausage - 1 link (1 oz) has 17 mg.  Salmon - 1 oz has 16 mg.  Tilapia - 1 oz has 14 mg. Dairy  Soft-serve ice cream -  cup (4 oz) has 103 mg.  Whole-milk yogurt - 1 cup (8 oz) has 29 mg.  Cheddar cheese - 1 oz has 28 mg.  American cheese - 1 oz has 28 mg.  Whole milk - 1 cup (8 oz) has 23 mg.  2% milk - 1 cup (8 oz) has 18 mg.  Cream cheese - 1 tablespoon (Tbsp) has 15 mg.  Cottage cheese -  cup (4 oz) has 14 mg.  Low-fat (1%) milk - 1 cup (8 oz) has 10 mg.  Sour cream - 1 Tbsp has 8.5 mg.  Low-fat yogurt - 1 cup (8 oz) has 8 mg.  Nonfat Greek yogurt - 1 cup (8 oz) has 7 mg.  Half-and-half cream - 1 Tbsp has 5 mg. Fats and oils  Cod liver oil - 1 tablespoon (Tbsp) has 82 mg.  Butter - 1 Tbsp has 15 mg.  Lard - 1 Tbsp has 14 mg.  Bacon grease - 1 Tbsp has 14 mg.  Mayonnaise - 1 Tbsp has 5-10 mg.  Margarine - 1 Tbsp has 3-10 mg. Exact amounts of cholesterol in these foods may vary depending on specific ingredients and brands. Foods without cholesterol Most plant-based foods do not have cholesterol unless you combine them with a food that has cholesterol. Foods without cholesterol include:  Grains and cereals.  Vegetables.  Fruits.  Vegetable oils, such as olive, canola, and sunflower oil.  Legumes, such as peas, beans, and lentils.  Nuts and seeds.  Egg whites. Summary  The body needs cholesterol in small amounts, but too much cholesterol can cause damage  to the arteries and heart.  Most people should eat less than 200 milligrams (mg) of cholesterol a day. This information is not intended to replace advice given to you by your health care provider. Make sure you discuss any questions you have with your health care provider. Document Released: 09/23/2016 Document Revised: 01/09/2017 Document Reviewed: 09/23/2016 Elsevier Patient Education  2020 [employer].

## 2019-01-11 LAB — COMPREHENSIVE METABOLIC PANEL
ALT: 99 IU/L — ABNORMAL HIGH (ref 0–44)
AST: 40 IU/L (ref 0–40)
Albumin/Globulin Ratio: 1.9 (ref 1.2–2.2)
Albumin: 4.5 g/dL (ref 4.0–5.0)
Alkaline Phosphatase: 83 IU/L (ref 39–117)
BUN/Creatinine Ratio: 11 (ref 9–20)
BUN: 12 mg/dL (ref 6–24)
Bilirubin Total: 0.3 mg/dL (ref 0.0–1.2)
CO2: 22 mmol/L (ref 20–29)
Calcium: 9.5 mg/dL (ref 8.7–10.2)
Chloride: 105 mmol/L (ref 96–106)
Creatinine, Ser: 1.06 mg/dL (ref 0.76–1.27)
GFR calc Af Amer: 100 mL/min/{1.73_m2} (ref >59)
GFR calc non Af Amer: 87 mL/min/{1.73_m2} (ref >59)
Globulin, Total: 2.4 g/dL (ref 1.5–4.5)
Glucose: 98 mg/dL (ref 65–99)
Potassium: 4.3 mmol/L (ref 3.5–5.2)
Sodium: 140 mmol/L (ref 134–144)
Total Protein: 6.9 g/dL (ref 6.0–8.5)

## 2019-01-11 LAB — LIPID PANEL
Chol/HDL Ratio: 5.7 ratio — ABNORMAL HIGH (ref 0.0–5.0)
Cholesterol, Total: 206 mg/dL — ABNORMAL HIGH (ref 100–199)
HDL: 36 mg/dL — ABNORMAL LOW (ref >39)
LDL Chol Calc (NIH): 152 mg/dL — ABNORMAL HIGH (ref 0–99)
Triglycerides: 97 mg/dL (ref 0–149)
VLDL Cholesterol Cal: 18 mg/dL (ref 5–40)

## 2019-04-27 ENCOUNTER — Encounter: Payer: Self-pay | Admitting: Family Medicine

## 2019-06-24 ENCOUNTER — Other Ambulatory Visit: Payer: Self-pay

## 2019-06-24 ENCOUNTER — Ambulatory Visit (INDEPENDENT_AMBULATORY_CARE_PROVIDER_SITE_OTHER): Payer: Self-pay | Admitting: Family Medicine

## 2019-06-24 ENCOUNTER — Encounter: Payer: Self-pay | Admitting: Family Medicine

## 2019-06-24 VITALS — BP 114/75 | HR 74 | Temp 98.2°F | Ht 67.0 in | Wt 171.6 lb

## 2019-06-24 DIAGNOSIS — E785 Hyperlipidemia, unspecified: Secondary | ICD-10-CM

## 2019-06-24 NOTE — Progress Notes (Signed)
Established Patient Office Visit  Subjective:  Patient ID: Henry Harrington, male    DOB: [birth year]  Age: 44 y.o. MRN: [id number]  CC:  Chief Complaint  Patient presents with  . Medical Management of Chronic Issues    hyperlipidemia    HPI Henry Harrington presents for   Dyslipidemia: Patient presents for evaluation of lipids.  Compliance with treatment thus far has been excellent.  A repeat fasting lipid profile was done.  The patient does use medications that may worsen dyslipidemias (corticosteroids, progestins, anabolic steroids, diuretics, beta-blockers, amiodarone, cyclosporine, olanzapine). The patient exercises three times a week.  The patient is not known to have coexisting coronary artery disease.   He is using red yeast rice, fish oil.     Past Medical History:  Diagnosis Date  . Constipation 06/09/2007   Qualifier: Diagnosis of  By: [name], [name]    . Gastroparesis 06/09/2007   Qualifier: Diagnosis of  By: [name], [name]      No past surgical history on file.  Family History  Problem Relation Age of Onset  . Cancer Father   . Stroke Father     Social History   Socioeconomic History  . Marital status: Married    Spouse name: Labarron Durnin  . Number of children: 3  . Years of education: 12th grade  . Highest education level: Not on file  Occupational History  . 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 Smoking status: Former Smoker    Packs/day: 1.00    Years: 7.00    Pack years: 7.00    Types: Cigarettes    Quit date: 02/10/1997    Years since quitting: 22.3  . Smokeless tobacco: Never Used  Substance and Sexual Activity  . Alcohol use: No    Alcohol/week: 0.0 standard drinks  . Drug use: No  . Sexual activity: Yes    Partners: Female  Other Topics Concern  . Not on file  Social History Narrative   He has two daughters and one son.    Former Engineer, manufacturing   Social Determinants of Corporate investment banker Strain:   .  Difficulty of Paying Living Expenses:   Food Insecurity:   . Worried About Programme researcher, broadcasting/film/video in the Last Year:   . Barista in the Last Year:   Transportation Needs:   . Freight forwarder (Medical):   Marland Kitchen Lack of Transportation (Non-Medical):   Physical Activity:   . Days of Exercise per Week:   . Minutes of Exercise per Session:   Stress:   . Feeling of Stress :   Social Connections:   . Frequency of Communication with Friends and Family:   . Frequency of Social Gatherings with Friends and Family:   . Attends Religious Services:   . Active Member of Clubs or Organizations:   . Attends Banker Meetings:   Marland Kitchen Marital Status:   Intimate Partner Violence:   . Fear of Current or Ex-Partner:   . Emotionally Abused:   Marland Kitchen Physically Abused:   . Sexually Abused:     Outpatient Medications Prior to Visit  Medication Sig Dispense Refill  . Multiple Vitamins-Minerals (MULTI-VITAMIN GUMMIES PO) Take by mouth.    . Omega-3 Fatty Acids (FISH OIL) 1000 MG CPDR Take by mouth.    . Red Yeast Rice Extract (RED YEAST RICE PO) Take by mouth.     No facility-administered medications prior to visit.  No Known Allergies  ROS Review of Systems Review of Systems  Constitutional: Negative for activity change, appetite change, chills and fever.  HENT: Negative for congestion, nosebleeds, trouble swallowing and voice change.   Respiratory: Negative for cough, shortness of breath and wheezing.   Gastrointestinal: Negative for diarrhea, nausea and vomiting.  Genitourinary: Negative for difficulty urinating, dysuria, flank pain and hematuria.  Musculoskeletal: Negative for back pain, joint swelling and neck pain.  Neurological: Negative for dizziness, speech difficulty, light-headedness and numbness.  See HPI. All other review of systems negative.     Objective:    Physical Exam  BP 114/75 (BP Location: Right Arm, Patient Position: Sitting, Cuff Size: Normal)   Pulse  74   Temp 98.2 F (36.8 C) (Temporal)   Ht 5\' 7"  (1.702 m)   Wt 171 lb 9.6 oz (77.8 kg)   SpO2 98%   BMI 26.88 kg/m  Wt Readings from Last 3 Encounters:  06/24/19 171 lb 9.6 oz (77.8 kg)  01/10/19 172 lb 3.2 oz (78.1 kg)  12/18/17 164 lb (74.4 kg)   Physical Exam  Constitutional: Oriented to person, place, and time. Appears well-developed and well-nourished.  HENT:  Head: Normocephalic and atraumatic.  Eyes: Conjunctivae and EOM are normal.  Cardiovascular: Normal rate, regular rhythm, normal heart sounds and intact distal pulses.  No murmur heard. Pulmonary/Chest: Effort normal and breath sounds normal. No stridor. No respiratory distress. Has no wheezes.  Neurological: Is alert and oriented to person, place, and time.  Skin: Skin is warm. Capillary refill takes less than 2 seconds.  Psychiatric: Has a normal mood and affect. Behavior is normal. Judgment and thought content normal.    There are no preventive care reminders to display for this patient.  There are no preventive care reminders to display for this patient.  Lab Results  Component Value Date   TSH 2.490 12/10/2016   Lab Results  Component Value Date   WBC 5.8 12/18/2017   HGB 15.3 12/18/2017   HCT 45.1 12/18/2017   MCV 89 12/18/2017   PLT 285 12/18/2017   Lab Results  Component Value Date   NA 140 01/10/2019   K 4.3 01/10/2019   CO2 22 01/10/2019   GLUCOSE 98 01/10/2019   BUN 12 01/10/2019   CREATININE 1.06 01/10/2019   BILITOT 0.3 01/10/2019   ALKPHOS 83 01/10/2019   AST 40 01/10/2019   ALT 99 (H) 01/10/2019   PROT 6.9 01/10/2019   ALBUMIN 4.5 01/10/2019   CALCIUM 9.5 01/10/2019   Lab Results  Component Value Date   CHOL 206 (H) 01/10/2019   Lab Results  Component Value Date   HDL 36 (L) 01/10/2019   Lab Results  Component Value Date   LDLCALC 152 (H) 01/10/2019   Lab Results  Component Value Date   TRIG 97 01/10/2019   Lab Results  Component Value Date   CHOLHDL 5.7 (H)  01/10/2019   No results found for: HGBA1C    Assessment & Plan:   Problem List Items Addressed This Visit    None    Visit Diagnoses    Dyslipidemia    -  Primary   Relevant Orders   Lipid Panel    Discussed medications that affect lipids Reminded patient to avoid grapefruits Reviewed last 3 lipids Advised dietary fiber and fish oil and ways to keep HDL high CAD prevention and reviewed side effects of statins Discussed that if lipids are elevated despite lifestyle changes, red yeast rice and omega 3  he will need a medication to treat his familial hypercholesterolemia   No orders of the defined types were placed in this encounter.   Follow-up: No follow-ups on file.    Forrest Moron, MD

## 2019-06-24 NOTE — Patient Instructions (Addendum)
Wt Readings from Last 3 Encounters:  06/24/19 171 lb 9.6 oz (77.8 kg)  01/10/19 172 lb 3.2 oz (78.1 kg)  12/18/17 164 lb (74.4 kg)      If you have lab work done today you will be contacted with your lab results within the next 2 weeks.  If you have not heard from [place] then please contact us. The fastest way to get your results is to register for My Chart.   IF you received an x-ray today, you will receive an invoice from [facility]. Please contact [facility] at [phone] with questions or concerns regarding your invoice.   IF you received labwork today, you will receive an invoice from [facility]. Please contact [facility] at [phone] with questions or concerns regarding your invoice.   Our billing staff will not be able to assist you with questions regarding bills from these companies.  You will be contacted with the lab results as soon as they are available. The fastest way to get your results is to activate your My Chart account. Instructions are located on the last page of this paperwork. If you have not heard from [place] regarding the results in 2 weeks, please contact this office.      Familial Hypercholesterolemia Familial hypercholesterolemia (FH) is a genetic disorder that causes a very high level of LDL (low-density lipoprotein) cholesterol. Cholesterol is a waxy, fat-like substance that your body needs to build cells. Your body makes all the cholesterol it needs in the liver and removes extra (excess) cholesterol from the blood as needed. Excess cholesterol comes from food that you eat. In people who have FH, the body is not able to remove LDL cholesterol from the blood as it should. A high level of LDL cholesterol puts you at higher risk for narrowing and hardening of your arteries (atherosclerosis) at an early age. This raises your risk for heart disease and stroke. What are the causes? FH is passed from parent to child (inherited). FH is caused by an  inherited gene defect (genetic mutation) that makes it hard for the liver to remove LDL cholesterol from the blood. The gene may be inherited from one parent or both parents. What increases the risk? You may be at higher risk for FH if:  You have a family history of the condition. If both parents carry the genetic mutation, their children are at higher risk for a more severe form of FH, with symptoms that start at an earlier age. What are the signs or symptoms? You may have a high level of LDL cholesterol before you develop symptoms. Symptoms of FH may include:  Cholesterol nodules (xanthomas) on the cords of tissue that connect muscles to bones (tendons). Xanthomas often form on the long tendon at the back of the ankle (Achilles tendon) or on the tendons on the back of the hands.  Cholesterol deposits (xanthelasmas) under the skin of the eyelids.  A gray or blue ring around the white part of the eye (corneal arcus). Complications of FH can occur due to atherosclerosis. Atherosclerosis may cause damage to an area of the body that is not getting enough blood. Complications of FH may include:  Chest pain (angina) and shortness of breath due to narrowed or blocked arteries in the heart (coronary artery disease).  Pain and cramping in the back of the lower legs (calves) when walking (claudication).  Interruption in blood flow to the brain (stroke). This may cause: ? Loss of balance. ? Vision loss. ? Sudden weakness  or numbness on one side of the body. How is this diagnosed? This condition may be diagnosed based on:  Your symptoms.  Your medical history, including any family history of FH or early coronary heart disease.  A physical exam.  A blood test to check for the genetic mutation that causes FH. Your family members may also be tested. How is this treated? There is no cure for FH, but treatment can lower LDL cholesterol levels and lower your risk for heart attack or stroke.  Treatment should be started as soon as you are diagnosed. Treatment may include:  A type of medicine that lowers your cholesterol (statin). If statins do not help, your health care provider may try other kinds of cholesterol-lowering medicines. The exact combination of medicines depends on the severity of your symptoms.  A procedure to filter LDL from your blood (apheresis). You may need this treatment if you have a severe form of FH.  Making lifestyle changes that are healthy for your heart, such as lowering the amount of fat and cholesterol in your diet. Follow these instructions at home: Lifestyle   Lose weight, if directed by your health care provider.  Follow instructions from your health care provider about eating a healthy diet. Your health care provider may recommend: ? Working with a diet and nutrition specialist (dietitian), who can help you make a healthy eating plan and help you maintain a healthy weight. ? Eating less fat and cholesterol. Avoid fatty meats, fried foods, and whole-fat dairy. ? Eating more vegetables, fruits, and whole grains. ? Limiting your intake of alcohol.  Be physically active. Ask your health care provider what type of exercise is best for you.  Do not use any products that contain nicotine or tobacco, such as cigarettes and e-cigarettes. If you need help quitting, ask your health care provider.  Work with your health care provider to manage any other conditions you have, such as high blood pressure (hypertension) or diabetes. These conditions affect your heart. General instructions  Take over-the-counter and prescription medicines only as told by your health care provider.  Keep all follow-up visits as told by your health care provider. This is important. Contact a health care provider if:  You have pain or cramps in your calf when you walk. Get help right away if:   You have sudden, unexplained discomfort in your chest, arms, back, neck, jaw, or  upper body.  You have trouble breathing.  You have a sudden, severe headache with no known cause.  You have any symptoms of a stroke. "BE FAST" is an easy way to remember the main warning signs of a stroke: ? B - Balance. Signs are dizziness, sudden trouble walking, or loss of balance. ? E - Eyes. Signs are trouble seeing or a sudden change in vision. ? F - Face. Signs are sudden weakness or numbness of the face, or the face or eyelid drooping on one side. ? A - Arms. Signs are weakness or numbness in an arm. This happens suddenly and usually on one side of the body. ? S - Speech. Signs are sudden trouble speaking, slurred speech, or trouble understanding what people say. ? T - Time. Time to call emergency services. Write down what time symptoms started. These symptoms may represent a serious problem that is an emergency. Do not wait to see if the symptoms will go away. Get medical help right away. Call your local emergency services (911 in the [place] Do not drive yourself to  the hospital. Summary  Familial hypercholesterolemia (FH) is a genetic disorder that causes a very high level of LDL (low-density lipoprotein) cholesterol.  FH increases your risk for coronary heart disease and stroke at an early age.  Treatment for FH should be started as soon as you are diagnosed with the condition. Treatment is aimed at lowering your risk for complications.  Follow instructions from your health care provider about eating a healthy diet. Your health care provider may recommend eating less fat and cholesterol. This information is not intended to replace advice given to you by your health care provider. Make sure you discuss any questions you have with your health care provider. Document Revised: 01/09/2017 Document Reviewed: 07/10/2016 Elsevier Patient Education  2020 [employer].

## 2019-06-25 LAB — LIPID PANEL
Chol/HDL Ratio: 6 ratio — ABNORMAL HIGH (ref 0.0–5.0)
Cholesterol, Total: 216 mg/dL — ABNORMAL HIGH (ref 100–199)
HDL: 36 mg/dL — ABNORMAL LOW (ref >39)
LDL Chol Calc (NIH): 162 mg/dL — ABNORMAL HIGH (ref 0–99)
Triglycerides: 101 mg/dL (ref 0–149)
VLDL Cholesterol Cal: 18 mg/dL (ref 5–40)

## 2019-06-27 ENCOUNTER — Other Ambulatory Visit: Payer: Self-pay | Admitting: Family Medicine

## 2019-06-27 MED ORDER — ATORVASTATIN CALCIUM 10 MG PO TABS: 10.0000 mg | ORAL_TABLET | Freq: Every day | ORAL | 3 refills | Status: DC

## 2020-04-23 ENCOUNTER — Other Ambulatory Visit: Payer: Self-pay

## 2020-04-23 ENCOUNTER — Encounter: Payer: Self-pay | Admitting: Nurse Practitioner

## 2020-04-23 ENCOUNTER — Ambulatory Visit (INDEPENDENT_AMBULATORY_CARE_PROVIDER_SITE_OTHER): Payer: Self-pay | Admitting: Nurse Practitioner

## 2020-04-23 VITALS — BP 100/65 | HR 65 | Temp 98.6°F | Ht 67.0 in | Wt 175.2 lb

## 2020-04-23 DIAGNOSIS — Z7689 Persons encountering health services in other specified circumstances: Secondary | ICD-10-CM

## 2020-04-23 DIAGNOSIS — E782 Mixed hyperlipidemia: Secondary | ICD-10-CM

## 2020-04-23 DIAGNOSIS — E785 Hyperlipidemia, unspecified: Secondary | ICD-10-CM | POA: Insufficient documentation

## 2020-04-23 MED ORDER — ATORVASTATIN CALCIUM 10 MG PO TABS: 10.0000 mg | ORAL_TABLET | Freq: Every day | ORAL | 1 refills | Status: DC

## 2020-04-23 NOTE — Progress Notes (Signed)
New Patient Office Visit  Subjective:  Patient ID: Henry Harrington, male    DOB: 09/28/1975  Age: 45 y.o. MRN: [id number]  CC:  Chief Complaint  Patient presents with  . New Patient (Initial Visit)    HPI Henry Harrington presents to establish new primary care provider. He is transferring care to be closer to his home. The patient is up to date with preventive care. He has history of hyperlipidemia. His most recent lipid panel was done 12/2019. His total cholesterol was 216, LDL 162, and cholesterol/HDL ratio was 6.0, putting him at increased risk for atherosclerotic disease due to lipid rich plaque. He does take atorvastatin every evening and tries to consume a healthy and well balanced diet. Lives a healthy lifestyle in general.  Has no concerns or complaints today.  States that he did get COVID 19 booster and flu vaccine at [facility] on [address]. Got both vaccines at the same time.  States that, three years ago, his father passed away from metastatic lymphoma or leukemia (he is unsure of exact diagnosis). The patient would like to know what his chances are of getting the same illness.   Past Medical History:  Diagnosis Date  . Constipation 06/09/2007   Qualifier: Diagnosis of  By: [name], [name]    . Gastroparesis 06/09/2007   Qualifier: Diagnosis of  By: [name], [name]      History reviewed. No pertinent surgical history.  Family History  Problem Relation Age of Onset  . Cancer Father   . Stroke Father     Social History   Socioeconomic History  . Marital status: Married    Spouse name: Henry Harrington  . Number of children: 3  . Years of education: 12th grade  . Highest education level: Not on file  Occupational History  . 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 Smoking status: Former Smoker    Packs/day: 1.00    Years: 7.00    Pack years: 7.00    Types: Cigarettes    Quit date: 02/10/1997    Years since quitting: 23.2  . Smokeless tobacco: Never  Used  Vaping Use  . Vaping Use: Never used  Substance and Sexual Activity  . Alcohol use: No    Alcohol/week: 0.0 standard drinks  . Drug use: No  . Sexual activity: Yes    Partners: Female  Other Topics Concern  . Not on file  Social History Narrative   He has two daughters and one son.    Former Engineer, manufacturing   Social Determinants of Corporate investment banker Strain: Not on file  Food Insecurity: Not on file  Transportation Needs: Not on file  Physical Activity: Not on file  Stress: Not on file  Social Connections: Not on file  Intimate Partner Violence: Not on file    ROS Review of Systems  Constitutional: Negative for activity change, chills and fever.  HENT: Negative for congestion and sinus pain.   Respiratory: Negative for apnea, cough and wheezing.   Cardiovascular: Negative for chest pain and palpitations.  Gastrointestinal: Negative for constipation.  Endocrine: Negative for cold intolerance, heat intolerance, polydipsia and polyuria.  Musculoskeletal: Negative for back pain and myalgias.  Skin: Negative for rash.  Allergic/Immunologic: Negative for environmental allergies.  Neurological: Negative for dizziness, weakness and headaches.  Hematological: Negative for adenopathy. Does not bruise/bleed easily.  Psychiatric/Behavioral: The patient is not nervous/anxious.   All other systems reviewed and are negative.  Objective:   Today's Vitals   04/23/20 1051  BP: 100/65  Pulse: 65  Temp: 98.6 F (37 C)  SpO2: 96%  Weight: 175 lb 3.2 oz (79.5 kg)  Height: 5\' 7"  (1.702 m)   Body mass index is 27.44 kg/m.    Physical Exam Vitals and nursing note reviewed.  Constitutional:      Appearance: Normal appearance. He is well-developed.  HENT:     Head: Normocephalic and atraumatic.     Nose: Nose normal.  Eyes:     Pupils: Pupils are equal, round, and reactive to light.  Cardiovascular:     Rate and Rhythm: Normal rate and regular rhythm.      Pulses: Normal pulses.     Heart sounds: Normal heart sounds.  Pulmonary:     Effort: Pulmonary effort is normal.     Breath sounds: Normal breath sounds.  Abdominal:     Palpations: Abdomen is soft.  Musculoskeletal:        General: Normal range of motion.     Cervical back: Normal range of motion and neck supple.  Skin:    General: Skin is warm and dry.     Capillary Refill: Capillary refill takes less than 2 seconds.  Neurological:     General: No focal deficit present.     Mental Status: He is alert and oriented to person, place, and time.  Psychiatric:        Mood and Affect: Mood normal.        Behavior: Behavior normal.        Thought Content: Thought content normal.        Judgment: Judgment normal.     Assessment & Plan:  1. Encounter to establish care Appointment today to establish new primary care provider.   2. Mixed hyperlipidemia Continue atorvastatin 10mg  daily with supper. Will recheck fasting blood work prior to next visit and adjust dosing as indicated.  - atorvastatin (LIPITOR) 10 MG tablet; Take 1 tablet (10 mg total) by mouth daily.  Dispense: 90 tablet; Refill: 1   Problem List Items Addressed This Visit      Other   Encounter to establish care - Primary   Mixed hyperlipidemia   Relevant Medications   atorvastatin (LIPITOR) 10 MG tablet      Outpatient Encounter Medications as of 04/23/2020  Medication Sig  . Multiple Vitamins-Minerals (MULTI-VITAMIN GUMMIES PO) Take by mouth.  . [DISCONTINUED] atorvastatin (LIPITOR) 10 MG tablet Take 1 tablet (10 mg total) by mouth daily.  atorvastatin (LIPITOR) 10 MG tablet Take 1 tablet (10 mg total) by mouth daily.  . Omega-3 Fatty Acids (FISH OIL) 1000 MG CPDR Take by mouth. (Patient not taking: Reported on 04/23/2020)  . Red Yeast Rice Extract (RED YEAST RICE PO) Take by mouth. (Patient not taking: Reported on 04/23/2020)   No facility-administered encounter medications on file as of 04/23/2020.    Time spent with the patient was approximately 30 minutes. This time included reviewing progress notes, labs, imaging studies, and discussing plan for follow up.   Follow-up: Return in about 4 months (around 08/23/2020) for health maintenance exam - FBW about 1 week prior to visit if possible. 04/25/2020   08/25/2020, NP

## 2020-04-23 NOTE — Patient Instructions (Signed)
Preventing High Cholesterol Cholesterol is a white, waxy substance similar to fat that the human body needs to help build cells. The liver makes all the cholesterol that a person's body needs. Having high cholesterol (hypercholesterolemia) increases your risk for heart disease and stroke. Extra or excess cholesterol comes from the food that you eat. High cholesterol can often be prevented with diet and lifestyle changes. If you already have high cholesterol, you can control it with diet, lifestyle changes, and medicines. How can high cholesterol affect me? If you have high cholesterol, fatty deposits (plaques) may build up on the walls of your blood vessels. The blood vessels that carry blood away from your heart are called arteries. Plaques make the arteries narrower and stiffer. This in turn can:  Restrict or block blood flow and cause blood clots to form.  Increase your risk for heart attack and stroke. What can increase my risk for high cholesterol? This condition is more likely to develop in people who:  Eat foods that are high in saturated fat or cholesterol. Saturated fat is mostly found in foods that come from animal sources.  Are overweight.  Are not getting enough exercise.  Have a family history of high cholesterol (familial hypercholesterolemia). What actions can I take to prevent this? Nutrition  Eat less saturated fat.  Avoid trans fats (partially hydrogenated oils). These are often found in margarine and in some baked goods, fried foods, and snacks bought in packages.  Avoid precooked or cured meat, such as bacon, sausages, or meat loaves.  Avoid foods and drinks that have added sugars.  Eat more fruits, vegetables, and whole grains.  Choose healthy sources of protein, such as fish, poultry, lean cuts of red meat, beans, peas, lentils, and nuts.  Choose healthy sources of fat, such as: ? Nuts. ? Vegetable oils, especially olive oil. ? Fish that have healthy fats,  such as omega-3 fatty acids. These fish include mackerel or salmon.   Lifestyle  Lose weight if you are overweight. Maintaining a healthy body mass index (BMI) can help prevent or control high cholesterol. It can also lower your risk for diabetes and high blood pressure. Ask your health care provider to help you with a diet and exercise plan to lose weight safely.  Do not use any products that contain nicotine or tobacco, such as cigarettes, e-cigarettes, and chewing tobacco. If you need help quitting, ask your health care provider. Alcohol use  Do not drink alcohol if: ? Your health care provider tells you not to drink. ? You are pregnant, may be pregnant, or are planning to become pregnant.  If you drink alcohol: ? Limit how much you use to:  0-1 drink a day for women.  0-2 drinks a day for men. ? Be aware of how much alcohol is in your drink. In the [place] one drink equals one 12 oz bottle of beer (355 mL), one 5 oz glass of wine (148 mL), or one 1 oz glass of hard liquor (44 mL). Activity  Get enough exercise. Do exercises as told by your health care provider.  Each week, do at least 150 minutes of exercise that takes a medium level of effort (moderate-intensity exercise). This kind of exercise: ? Makes your heart beat faster while allowing you to still be able to talk. ? Can be done in short sessions several times a day or longer sessions a few times a week. For example, on 5 days each week, you could walk fast or ride   your bike 3 times a day for 10 minutes each time.   Medicines  Your health care provider may recommend medicines to help lower cholesterol. This may be a medicine to lower the amount of cholesterol that your liver makes. You may need medicine if: ? Diet and lifestyle changes have not lowered your cholesterol enough. ? You have high cholesterol and other risk factors for heart disease or stroke.  Take over-the-counter and prescription medicines only as told by your  health care provider. General information  Manage your risk factors for high cholesterol. Talk with your health care provider about all your risk factors and how to lower your risk.  Manage other conditions that you have, such as diabetes or high blood pressure (hypertension).  Have blood tests to check your cholesterol levels at regular points in time as told by your health care provider.  Keep all follow-up visits as told by your health care provider. This is important. Where to find more information  American Heart Association: www.heart.org  National Heart, Lung, and Blood Institute: www.nhlbi.nih.gov Summary  High cholesterol increases your risk for heart disease and stroke. By keeping your cholesterol level low, you can reduce your risk for these conditions.  High cholesterol can often be prevented with diet and lifestyle changes.  Work with your health care provider to manage your risk factors, and have your blood tested regularly. This information is not intended to replace advice given to you by your health care provider. Make sure you discuss any questions you have with your health care provider. Document Revised: 11/09/2018 Document Reviewed: 11/09/2018 Elsevier Patient Education  2021 Elsevier Inc.  

## 2020-05-17 ENCOUNTER — Encounter: Payer: Self-pay | Admitting: Nurse Practitioner

## 2020-08-20 ENCOUNTER — Other Ambulatory Visit: Payer: Self-pay | Admitting: Nurse Practitioner

## 2020-08-20 DIAGNOSIS — E782 Mixed hyperlipidemia: Secondary | ICD-10-CM

## 2020-08-20 DIAGNOSIS — Z0000 Encounter for general adult medical examination without abnormal findings: Secondary | ICD-10-CM

## 2020-08-20 DIAGNOSIS — Z6825 Body mass index (BMI) 25.0-25.9, adult: Secondary | ICD-10-CM

## 2020-08-21 ENCOUNTER — Other Ambulatory Visit: Payer: Self-pay

## 2020-08-21 DIAGNOSIS — Z6825 Body mass index (BMI) 25.0-25.9, adult: Secondary | ICD-10-CM

## 2020-08-21 DIAGNOSIS — Z0000 Encounter for general adult medical examination without abnormal findings: Secondary | ICD-10-CM

## 2020-08-21 DIAGNOSIS — E782 Mixed hyperlipidemia: Secondary | ICD-10-CM

## 2020-08-22 LAB — COMPREHENSIVE METABOLIC PANEL
ALT: 35 IU/L (ref 0–44)
AST: 21 IU/L (ref 0–40)
Albumin/Globulin Ratio: 2.1 (ref 1.2–2.2)
Albumin: 4.4 g/dL (ref 4.0–5.0)
Alkaline Phosphatase: 81 IU/L (ref 44–121)
BUN/Creatinine Ratio: 12 (ref 9–20)
BUN: 12 mg/dL (ref 6–24)
Bilirubin Total: 0.3 mg/dL (ref 0.0–1.2)
CO2: 26 mmol/L (ref 20–29)
Calcium: 9.5 mg/dL (ref 8.7–10.2)
Chloride: 103 mmol/L (ref 96–106)
Creatinine, Ser: 0.98 mg/dL (ref 0.76–1.27)
Globulin, Total: 2.1 g/dL (ref 1.5–4.5)
Glucose: 90 mg/dL (ref 65–99)
Potassium: 4.4 mmol/L (ref 3.5–5.2)
Sodium: 141 mmol/L (ref 134–144)
Total Protein: 6.5 g/dL (ref 6.0–8.5)
eGFR: 98 mL/min/{1.73_m2} (ref >59)

## 2020-08-22 LAB — CBC
Hematocrit: 45 % (ref 37.5–51.0)
Hemoglobin: 15 g/dL (ref 13.0–17.7)
MCH: 30.4 pg (ref 26.6–33.0)
MCHC: 33.3 g/dL (ref 31.5–35.7)
MCV: 91 fL (ref 79–97)
Platelets: 216 10*3/uL (ref 150–450)
RBC: 4.93 x10E6/uL (ref 4.14–5.80)
RDW: 12.7 % (ref 11.6–15.4)
WBC: 5.6 10*3/uL (ref 3.4–10.8)

## 2020-08-22 LAB — LIPID PANEL
Chol/HDL Ratio: 5 ratio (ref 0.0–5.0)
Cholesterol, Total: 151 mg/dL (ref 100–199)
HDL: 30 mg/dL — ABNORMAL LOW (ref >39)
LDL Chol Calc (NIH): 98 mg/dL (ref 0–99)
Triglycerides: 129 mg/dL (ref 0–149)
VLDL Cholesterol Cal: 23 mg/dL (ref 5–40)

## 2020-08-22 LAB — HEMOGLOBIN A1C
Est. average glucose Bld gHb Est-mCnc: 114 mg/dL
Hgb A1c MFr Bld: 5.6 % (ref 4.8–5.6)

## 2020-08-22 LAB — TSH: TSH: 4.53 u[IU]/mL — ABNORMAL HIGH (ref 0.450–4.500)

## 2020-08-22 NOTE — Progress Notes (Signed)
Maybe mild hypothyroid. Discuss labs with patient at upcoming cpe

## 2020-08-23 ENCOUNTER — Ambulatory Visit (INDEPENDENT_AMBULATORY_CARE_PROVIDER_SITE_OTHER): Payer: Self-pay | Admitting: Nurse Practitioner

## 2020-08-23 ENCOUNTER — Other Ambulatory Visit: Payer: Self-pay

## 2020-08-23 ENCOUNTER — Encounter: Payer: Self-pay | Admitting: Nurse Practitioner

## 2020-08-23 VITALS — BP 98/62 | HR 76 | Temp 97.2°F | Ht 67.0 in | Wt 172.8 lb

## 2020-08-23 DIAGNOSIS — Z0001 Encounter for general adult medical examination with abnormal findings: Secondary | ICD-10-CM | POA: Insufficient documentation

## 2020-08-23 DIAGNOSIS — R7989 Other specified abnormal findings of blood chemistry: Secondary | ICD-10-CM | POA: Insufficient documentation

## 2020-08-23 DIAGNOSIS — Z6827 Body mass index (BMI) 27.0-27.9, adult: Secondary | ICD-10-CM

## 2020-08-23 DIAGNOSIS — E782 Mixed hyperlipidemia: Secondary | ICD-10-CM

## 2020-08-23 DIAGNOSIS — I951 Orthostatic hypotension: Secondary | ICD-10-CM | POA: Insufficient documentation

## 2020-08-23 NOTE — Progress Notes (Signed)
Established Patient Office Visit  Subjective:  Patient ID: Henry Harrington, male    DOB: 13-Apr-1975  Age: 45 y.o. MRN: [id number]  CC:  Chief Complaint  Patient presents with   Annual Exam    HPI SHONTE Harrington presents for annual wellness visit.  The patient had routine, fasting labs done prior to this visit.  Lipid panel much improved from previous checks.  He is taking atorvastatin now.  Lipid Panel     Component Value Date/Time   CHOL 151 08/21/2020 0817   TRIG 129 08/21/2020 0817   HDL 30 (L) 08/21/2020 0817   CHOLHDL 5.0 08/21/2020 0817   CHOLHDL 5.6 (H) 12/01/2015 0941   VLDL 15 12/01/2015 0941   LDLCALC 98 08/21/2020 0817   LABVLDL 23 08/21/2020 0817  Of note, his TSH was 4.590.  He denies fatigue, lethargy, constipation, or weight gain.  We will monitor this next year.  We will add T4 to labs next time.  All other labs were within normal limits. Patient denies physical concerns or complaints at this time. He denies chest pain, chest pressure, or shortness of breath. He denies headaches or visual disturbances. He denies abdominal pain, nausea, vomiting, or changes in bowel or bladder habits.    Past Medical History:  Diagnosis Date   Constipation 06/09/2007   Qualifier: Diagnosis of  By: Laney Potash, [name]     Gastroparesis 06/09/2007   Qualifier: Diagnosis of  By: Sharol Roussel      History reviewed. No pertinent surgical history.  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    Former Scientist, research (medical)   Social Determinants of Radio broadcast assistant Strain: Not on file  Food Insecurity: Not on file  Transportation Needs: Not on file  Physical Activity: Not on file  Stress: Not on file  Social Connections: Not on file  Intimate Partner Violence: Not on file    Outpatient Medications Prior to Visit  Medication Sig Dispense Refill   atorvastatin (LIPITOR) 10 MG tablet Take 1 tablet (10 mg total) by mouth daily. 90 tablet 1   Multiple Vitamins-Minerals (MULTI-VITAMIN GUMMIES PO) Take by mouth.     Omega-3 Fatty Acids (FISH OIL) 1000 MG CPDR Take by mouth. (Patient not taking: Reported on 04/23/2020)     Red Yeast Rice Extract (RED YEAST RICE PO) Take by mouth. (Patient not taking: Reported on 04/23/2020)     No facility-administered medications prior to visit.    No Known  Allergies  ROS Review of Systems  Constitutional:  Negative for chills and fever.  HENT:  Negative for congestion, postnasal drip, rhinorrhea, sinus pressure and sinus pain.   Eyes: Negative.   Respiratory:  Negative for apnea, cough, chest tightness, shortness of breath and wheezing.   Cardiovascular:  Negative for chest pain and palpitations.       The patient states that he has had some episodes of postural low-tension.  Mild dizziness with changing positions.  Gastrointestinal:  Negative for constipation, diarrhea, nausea and vomiting.  Endocrine: Negative for cold intolerance, heat intolerance, polydipsia and polyuria.  Genitourinary:  Negative for dysuria, frequency and urgency.  Musculoskeletal:  Negative for back pain and myalgias.  Skin:  Negative for rash.  Allergic/Immunologic: Negative.   Negative for environmental allergies.  Neurological:  Negative for dizziness, weakness and headaches.  Hematological:  Negative for adenopathy. Does not bruise/bleed easily.  Psychiatric/Behavioral:  Negative for behavioral problems and dysphoric mood. The patient is not nervous/anxious.      Objective:    Physical Exam Vitals and nursing note reviewed.  Constitutional:      Appearance: Normal appearance. He is well-developed.  HENT:     Head: Normocephalic and atraumatic.     Right Ear: Ear canal and external ear normal.     Left Ear: Ear canal and external ear normal.     Nose: Nose normal.     Mouth/Throat:     Mouth: Mucous membranes are moist.  Eyes:     Extraocular Movements: Extraocular movements intact.     Conjunctiva/sclera: Conjunctivae normal.     Pupils: Pupils are equal, round, and reactive to light.  Neck:     Vascular: No carotid bruit.  Cardiovascular:     Rate and Rhythm: Normal rate and regular rhythm.     Pulses: Normal pulses.     Heart sounds: Normal heart sounds.  Pulmonary:     Effort: Pulmonary effort is normal.     Breath sounds: Normal breath sounds.  Abdominal:     General: Bowel sounds are normal.     Palpations: Abdomen is soft.     Tenderness: There is no guarding.  Musculoskeletal:        General: Normal range of motion.     Cervical back: Normal range of motion and neck supple.  Lymphadenopathy:     Cervical: No cervical adenopathy.  Skin:    General: Skin is warm and dry.     Capillary Refill: Capillary refill takes less than 2 seconds.  Neurological:     General: No focal deficit present.     Mental Status: He is alert and oriented to person, place, and time.     Cranial Nerves: No cranial nerve deficit.     Sensory: No sensory deficit.     Motor: No weakness.     Coordination: Coordination normal.  Psychiatric:        Mood and Affect: Mood normal.        Behavior: Behavior normal.        Thought Content: Thought content normal.         Judgment: Judgment normal.    Today's Vitals   08/23/20 0904  BP: 98/62  Pulse: 76  Temp: (!) 97.2 F (36.2 C)  SpO2: 96%  Weight: 172 lb 12.8 oz (78.4 kg)  Height: 5' 7"  (1.702 m)   Body mass index is 27.06 kg/m.   Wt Readings from Last 3 Encounters:  08/23/20 172 lb 12.8 oz (  78.4 kg)  04/23/20 175 lb 3.2 oz (79.5 kg)  06/24/19 171 lb 9.6 oz (77.8 kg)     Health Maintenance Due  Topic Date Due   Hepatitis C Screening  Never done    There are no preventive care reminders to display for this patient.  Lab Results  Component Value Date   TSH 4.530 (H) 08/21/2020   Lab Results  Component Value Date   WBC 5.6 08/21/2020   HGB 15.0 08/21/2020   HCT 45.0 08/21/2020   MCV 91 08/21/2020   PLT 216 08/21/2020   Lab Results  Component Value Date   NA 141 08/21/2020   K 4.4 08/21/2020   CO2 26 08/21/2020   GLUCOSE 90 08/21/2020   BUN 12 08/21/2020   CREATININE 0.98 08/21/2020   BILITOT 0.3 08/21/2020   ALKPHOS 81 08/21/2020   AST 21 08/21/2020   ALT 35 08/21/2020   PROT 6.5 08/21/2020   ALBUMIN 4.4 08/21/2020   CALCIUM 9.5 08/21/2020   EGFR 98 08/21/2020   Lab Results  Component Value Date   CHOL 151 08/21/2020   Lab Results  Component Value Date   HDL 30 (L) 08/21/2020   Lab Results  Component Value Date   LDLCALC 98 08/21/2020   Lab Results  Component Value Date   TRIG 129 08/21/2020   Lab Results  Component Value Date   CHOLHDL 5.0 08/21/2020   Lab Results  Component Value Date   HGBA1C 5.6 08/21/2020      Assessment & Plan:  1. Encounter for general adult medical examination with abnormal findings Annual wellness visit today.  2. Mixed hyperlipidemia Reviewed labs.  Lipid panel much improved on recent checks.  Continue atorvastatin as prescribed.  3. Postural hypotension Patient describing few episodes of postural hypotension.  Encouraged him to increase his fluid intake.  Monitoring his episodes closely.  Patient contact  the office for frequent and more severe episodes of postural hypotension.  4. BMI 27.0-27.9,adult Patient should consume a 1500-calorie diet daily and incorporate exercise into his daily routine.  5. Elevated TSH  TSH 4.590 on recent labs.  Will recheck a TSH and add free T4 to lab back next year.  Problem List Items Addressed This Visit       Cardiovascular and Mediastinum   Postural hypotension     Other   BMI 25.0-25.9,adult   Mixed hyperlipidemia   Encounter for general adult medical examination with abnormal findings - Primary    No orders of the defined types were placed in this encounter.   Follow-up: Return in about 1 year (around 08/23/2021) for health maintenance exam - FBW a week prior and Add Free T4.    Ronnell Freshwater, NP

## 2020-10-15 ENCOUNTER — Other Ambulatory Visit: Payer: Self-pay | Admitting: Nurse Practitioner

## 2020-10-15 DIAGNOSIS — E782 Mixed hyperlipidemia: Secondary | ICD-10-CM

## 2021-02-17 ENCOUNTER — Encounter: Payer: Self-pay | Admitting: Nurse Practitioner

## 2021-02-18 ENCOUNTER — Other Ambulatory Visit: Payer: Self-pay | Admitting: Nurse Practitioner

## 2021-02-18 DIAGNOSIS — B354 Tinea corporis: Secondary | ICD-10-CM

## 2021-02-18 MED ORDER — KETOCONAZOLE 2 % EX CREA: 1.0000 "application " | TOPICAL_CREAM | Freq: Every day | CUTANEOUS | 1 refills | Status: DC

## 2021-06-25 ENCOUNTER — Other Ambulatory Visit: Payer: Self-pay

## 2021-06-25 ENCOUNTER — Telehealth: Payer: Self-pay | Admitting: Nurse Practitioner

## 2021-06-25 DIAGNOSIS — E782 Mixed hyperlipidemia: Secondary | ICD-10-CM

## 2021-06-25 MED ORDER — ATORVASTATIN CALCIUM 10 MG PO TABS: 10.0000 mg | ORAL_TABLET | Freq: Every day | ORAL | 1 refills | Status: DC

## 2021-06-25 NOTE — Telephone Encounter (Signed)
Patient requesting refill of the Atorvastatin. Please advise.  ?

## 2021-06-27 NOTE — Telephone Encounter (Signed)
Patient is aware 

## 2021-08-20 ENCOUNTER — Other Ambulatory Visit: Payer: Self-pay

## 2021-08-20 DIAGNOSIS — R7989 Other specified abnormal findings of blood chemistry: Secondary | ICD-10-CM

## 2021-08-20 DIAGNOSIS — Z0000 Encounter for general adult medical examination without abnormal findings: Secondary | ICD-10-CM

## 2021-08-21 LAB — COMPREHENSIVE METABOLIC PANEL
ALT: 35 IU/L (ref 0–44)
AST: 25 IU/L (ref 0–40)
Albumin/Globulin Ratio: 1.8 (ref 1.2–2.2)
Albumin: 4.5 g/dL (ref 4.1–5.1)
Alkaline Phosphatase: 91 IU/L (ref 44–121)
BUN/Creatinine Ratio: 13 (ref 9–20)
BUN: 13 mg/dL (ref 6–24)
Bilirubin Total: 0.5 mg/dL (ref 0.0–1.2)
CO2: 22 mmol/L (ref 20–29)
Calcium: 9.6 mg/dL (ref 8.7–10.2)
Chloride: 103 mmol/L (ref 96–106)
Creatinine, Ser: 1.02 mg/dL (ref 0.76–1.27)
Globulin, Total: 2.5 g/dL (ref 1.5–4.5)
Glucose: 95 mg/dL (ref 70–99)
Potassium: 4.4 mmol/L (ref 3.5–5.2)
Sodium: 140 mmol/L (ref 134–144)
Total Protein: 7 g/dL (ref 6.0–8.5)
eGFR: 93 mL/min/{1.73_m2} (ref >59)

## 2021-08-21 LAB — HEMOGLOBIN A1C
Est. average glucose Bld gHb Est-mCnc: 117 mg/dL
Hgb A1c MFr Bld: 5.7 % — ABNORMAL HIGH (ref 4.8–5.6)

## 2021-08-21 LAB — CBC
Hematocrit: 44.9 % (ref 37.5–51.0)
Hemoglobin: 15.2 g/dL (ref 13.0–17.7)
MCH: 31 pg (ref 26.6–33.0)
MCHC: 33.9 g/dL (ref 31.5–35.7)
MCV: 92 fL (ref 79–97)
Platelets: 225 10*3/uL (ref 150–450)
RBC: 4.9 x10E6/uL (ref 4.14–5.80)
RDW: 13 % (ref 11.6–15.4)
WBC: 5.6 10*3/uL (ref 3.4–10.8)

## 2021-08-21 LAB — LIPID PANEL
Chol/HDL Ratio: 4.7 ratio (ref 0.0–5.0)
Cholesterol, Total: 170 mg/dL (ref 100–199)
HDL: 36 mg/dL — ABNORMAL LOW (ref >39)
LDL Chol Calc (NIH): 119 mg/dL — ABNORMAL HIGH (ref 0–99)
Triglycerides: 82 mg/dL (ref 0–149)
VLDL Cholesterol Cal: 15 mg/dL (ref 5–40)

## 2021-08-21 LAB — T4, FREE: Free T4: 1.01 ng/dL (ref 0.82–1.77)

## 2021-08-21 LAB — TSH: TSH: 2.98 u[IU]/mL (ref 0.450–4.500)

## 2021-08-25 NOTE — Progress Notes (Signed)
Overall, labs good. Discuss with patient at visit 08/27/2021.

## 2021-08-26 NOTE — Progress Notes (Unsigned)
Complete physical exam   Patient: Henry Harrington   DOB: [birth year]   46 y.o. Male  MRN: [id number] Visit Date: 08/27/2021    No chief complaint on file.  Subjective    Henry Harrington is a 46 y.o. male who presents today for a complete physical exam.  He reports consuming a {diet types:17450} diet. {Exercise:19826} He generally feels {well/fairly well/poorly:18703}. He {does/does not:200015} have additional problems to discuss today.   HPI  Annual physical  -routine fasting labs done prior to this visit.  --normal blood glucose with HgbA1c 5.7  --mild elevation of LDL and decrease HDL  -generally well controlled blood pressure   Past Medical History:  Diagnosis Date   Constipation 06/09/2007   Qualifier: Diagnosis of  By: [name], [name]     Gastroparesis 06/09/2007   Qualifier: Diagnosis of  By: [name], [name]     No past surgical history on file. 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    Former Engineer, manufacturing   Social Determinants of Health   Financial Resource Strain: Low Risk  (12/18/2017)   Overall Financial Resource Strain (CARDIA)    Difficulty of Paying Living Expenses: Not hard at all  Food Insecurity: No Food Insecurity (12/18/2017)   Hunger Vital Sign     Worried About Running Out of Food in the Last Year: Never true    Ran Out of Food in the Last Year: Never true  Transportation Needs: No Transportation Needs (12/18/2017)   PRAPARE - Administrator, Civil Service (Medical): No    Lack of Transportation (Non-Medical): No  Physical Activity: Inactive (12/18/2017)   Exercise Vital Sign    Days of Exercise per Week: 0 days    Minutes of Exercise per Session: 0 min  Stress: Stress Concern Present (12/18/2017)   [employer] of Occupational Health - Occupational Stress Questionnaire    Feeling of Stress : To some extent  Social Connections: Socially Integrated (12/18/2017)   Social Connection and Isolation Panel [NHANES]    Frequency of Communication with Friends and Family: More than three times a week    Frequency of Social Gatherings with Friends and Family: Once a week    Attends Religious Services: More than 4 times per year    Active Member of [employer] or Organizations: Yes    Attends Banker Meetings: More than 4 times per year    Marital Status: Married  Catering manager Violence: Not At Risk (12/18/2017)   Humiliation, Afraid, Rape, and Kick questionnaire    Fear of Current or Ex-Partner: No  Emotionally Abused: No    Physically Abused: No    Sexually Abused: No   Family Status  Relation Name Status   Father  Alive   Mother  Alive   Brother  Alive   MGM  Deceased   MGF  Deceased   PGM  Deceased   PGF  Deceased   Daughter  Alive   Son  Alive   Daughter  Alive   Family History  Problem Relation Age of Onset   Cancer Father    Stroke Father    No Known Allergies  Patient Care Team: Carlean Jews, NP as PCP - General (Family Medicine)   Medications: Outpatient Medications Prior to Visit  Medication Sig   atorvastatin (LIPITOR) 10 MG tablet Take 1 tablet (10 mg total) by mouth daily.   ketoconazole (NIZORAL) 2 % cream Apply 1 application topically daily.   Multiple Vitamins-Minerals  (MULTI-VITAMIN GUMMIES PO) Take by mouth.   No facility-administered medications prior to visit.    Review of Systems  {Labs (Optional):23779}   Objective    There were no vitals taken for this visit. BP Readings from Last 3 Encounters:  08/23/20 98/62  04/23/20 100/65  06/24/19 114/75    Wt Readings from Last 3 Encounters:  08/23/20 172 lb 12.8 oz (78.4 kg)  04/23/20 175 lb 3.2 oz (79.5 kg)  06/24/19 171 lb 9.6 oz (77.8 kg)     Physical Exam  ***  Last depression screening scores    08/23/2020    9:06 AM 04/23/2020   10:57 AM 06/24/2019   10:29 AM  PHQ 2/9 Scores  PHQ - 2 Score 0 0 0  PHQ- 9 Score 0 0    Last fall risk screening    08/23/2020    9:06 AM  Fall Risk   Falls in the past year? 0  Number falls in past yr: 0  Injury with Fall? 0  Follow up Falls evaluation completed   Last Audit-C alcohol use screening     No data to display         A score of 3 or more in women, and 4 or more in men indicates increased risk for alcohol abuse, EXCEPT if all of the points are from question 1   No results found for any visits on 08/27/21.  Assessment & Plan    Routine Health Maintenance and Physical Exam  Exercise Activities and Dietary recommendations  Goals   None     Immunization History  Administered Date(s) Administered   Influenza,inj,Quad PF,6+ Mos 11/20/2014, 12/01/2015, 12/10/2016, 12/18/2017, 01/10/2019   Influenza-Unspecified 11/11/2019   PFIZER(Purple Top)SARS-COV-2 Vaccination 04/18/2019, 05/21/2019, 11/11/2019   Tdap 11/20/2014    Health Maintenance  Topic Date Due   Hepatitis C Screening  Never done   COVID-19 Vaccine (4 - Pfizer series) 01/06/2020   INFLUENZA VACCINE  09/10/2021   TETANUS/TDAP  11/19/2024   HIV Screening  Completed   HPV VACCINES  Aged Out    Discussed health benefits of physical activity, and encouraged him to engage in regular exercise appropriate for his age and condition.  Problem List Items Addressed This  Visit   None    No follow-ups on file.        Carlean Jews, NP  [facility] at [facility] [phone] (phone) [phone] (fax)  [facility]

## 2021-08-27 ENCOUNTER — Ambulatory Visit (INDEPENDENT_AMBULATORY_CARE_PROVIDER_SITE_OTHER): Payer: Self-pay | Admitting: Nurse Practitioner

## 2021-08-27 ENCOUNTER — Encounter: Payer: Self-pay | Admitting: Nurse Practitioner

## 2021-08-27 VITALS — BP 108/75 | HR 64 | Ht 66.5 in | Wt 174.1 lb

## 2021-08-27 DIAGNOSIS — Z6827 Body mass index (BMI) 27.0-27.9, adult: Secondary | ICD-10-CM

## 2021-08-27 DIAGNOSIS — E785 Hyperlipidemia, unspecified: Secondary | ICD-10-CM

## 2021-08-27 DIAGNOSIS — Z0001 Encounter for general adult medical examination with abnormal findings: Secondary | ICD-10-CM

## 2021-08-27 DIAGNOSIS — B354 Tinea corporis: Secondary | ICD-10-CM

## 2021-08-27 MED ORDER — KETOCONAZOLE 2 % EX CREA
1.0000 | TOPICAL_CREAM | Freq: Every day | CUTANEOUS | 3 refills | Status: DC
Start: 2021-08-27 — End: 2022-09-04

## 2021-12-16 ENCOUNTER — Other Ambulatory Visit: Payer: Self-pay

## 2021-12-16 ENCOUNTER — Telehealth: Payer: Self-pay | Admitting: *Deleted

## 2021-12-16 DIAGNOSIS — E782 Mixed hyperlipidemia: Secondary | ICD-10-CM

## 2021-12-16 MED ORDER — ATORVASTATIN CALCIUM 10 MG PO TABS: 10.0000 mg | ORAL_TABLET | Freq: Every day | ORAL | 1 refills | Status: DC

## 2021-12-16 NOTE — Telephone Encounter (Signed)
Pt requesting refill for Atorvastatin.  Please send to [facility]. Axell Trigueros Zimmerman Rumple, CMA

## 2021-12-16 NOTE — Telephone Encounter (Signed)
Rx has been sent  

## 2022-06-16 ENCOUNTER — Telehealth: Payer: Self-pay

## 2022-06-16 ENCOUNTER — Other Ambulatory Visit: Payer: Self-pay

## 2022-06-16 DIAGNOSIS — E782 Mixed hyperlipidemia: Secondary | ICD-10-CM

## 2022-06-16 MED ORDER — ATORVASTATIN CALCIUM 10 MG PO TABS: 10.0000 mg | ORAL_TABLET | Freq: Every day | ORAL | 1 refills | Status: DC

## 2022-06-16 NOTE — Telephone Encounter (Signed)
Rx has been send to [facility]

## 2022-06-16 NOTE — Telephone Encounter (Signed)
Pt is requesting a refill on: atorvastatin (LIPITOR) 10 MG tablet   Pharmacy: [facility] - [place], [place] - [address]   LOV 08/27/21 ROV 09/01/22

## 2022-09-01 ENCOUNTER — Encounter: Payer: Self-pay | Admitting: Nurse Practitioner

## 2022-09-04 ENCOUNTER — Ambulatory Visit (INDEPENDENT_AMBULATORY_CARE_PROVIDER_SITE_OTHER): Payer: Self-pay | Admitting: Family Medicine

## 2022-09-04 ENCOUNTER — Encounter: Payer: Self-pay | Admitting: Family Medicine

## 2022-09-04 VITALS — BP 108/75 | HR 81 | Ht 66.5 in | Wt 176.8 lb

## 2022-09-04 DIAGNOSIS — Z1212 Encounter for screening for malignant neoplasm of rectum: Secondary | ICD-10-CM

## 2022-09-04 DIAGNOSIS — E785 Hyperlipidemia, unspecified: Secondary | ICD-10-CM

## 2022-09-04 DIAGNOSIS — Z1159 Encounter for screening for other viral diseases: Secondary | ICD-10-CM

## 2022-09-04 DIAGNOSIS — Z0000 Encounter for general adult medical examination without abnormal findings: Secondary | ICD-10-CM

## 2022-09-04 DIAGNOSIS — Z1211 Encounter for screening for malignant neoplasm of colon: Secondary | ICD-10-CM

## 2022-09-04 DIAGNOSIS — R7989 Other specified abnormal findings of blood chemistry: Secondary | ICD-10-CM

## 2022-09-04 DIAGNOSIS — Z6827 Body mass index (BMI) 27.0-27.9, adult: Secondary | ICD-10-CM

## 2022-09-04 MED ORDER — ATORVASTATIN CALCIUM 10 MG PO TABS
10.0000 mg | ORAL_TABLET | Freq: Every day | ORAL | 3 refills | Status: DC
Start: 2022-09-04 — End: 2023-09-21

## 2022-09-04 NOTE — Telephone Encounter (Signed)
Pt is requesting a Cologuard instead of Colonoscopy

## 2022-09-04 NOTE — Assessment & Plan Note (Signed)
Last lipid panel: LDL 119, HDL 36, triglycerides 82.  Continue atorvastatin 10 mg daily.  Will adjust management as indicated by lab results.

## 2022-09-04 NOTE — Progress Notes (Signed)
Complete physical exam  Patient: Henry Harrington   DOB: 04/05/1975   47 y.o. Male  MRN: [id number]  Subjective:    Chief Complaint  Patient presents with   Annual Exam    Henry Harrington is a 47 y.o. male who presents today for a complete physical exam. He reports consuming a general diet.  He stays active with his 73-year-old son.  He generally feels well. He reports sleeping well. He does not have additional problems to discuss today.    Most recent fall risk assessment:    09/04/2022   10:55 AM  Fall Risk   Falls in the past year? 0  Number falls in past yr: 0  Injury with Fall? 0  Risk for fall due to : No Fall Risks  Follow up Falls evaluation completed     Most recent depression and anxiety screenings:    09/04/2022   10:56 AM 08/27/2021    8:30 AM  PHQ 2/9 Scores  PHQ - 2 Score 0 0  PHQ- 9 Score 0 0      09/04/2022   10:56 AM 08/27/2021    8:31 AM 08/23/2020    9:06 AM  GAD 7 : Generalized Anxiety Score  Nervous, Anxious, on Edge 0 0 0  Control/stop worrying 0 0 0  Worry too much - different things 0 0 0  Trouble relaxing 0 0 0  Restless 0 0 0  Easily annoyed or irritable 0 0 0  Afraid - awful might happen 0 0 0  Total GAD 7 Score 0 0 0  Anxiety Difficulty Not difficult at all      Patient Active Problem List   Diagnosis Date Noted   Tinea corporis 08/27/2021   Postural hypotension 08/23/2020   Elevated TSH 08/23/2020   Hyperlipidemia LDL goal <100 04/23/2020   BMI 27.0-27.9,adult 12/01/2015   Low HDL (under 40) 12/01/2015    History reviewed. No pertinent surgical history. Social History   Tobacco Use   Smoking status: Former    Current packs/day: 0.00    Average packs/day: 1 pack/day for 7.0 years (7.0 ttl pk-yrs)    Types: Cigarettes    Start date: 02/10/1990    Quit date: 02/10/1997    Years since quitting: 25.5   Smokeless tobacco: Never  Vaping Use   Vaping status: Never Used  Substance Use Topics   Alcohol use: No    Alcohol/week: 0.0  standard drinks of alcohol   Drug use: No   Family History  Problem Relation Age of Onset   Cancer Father    Stroke Father    No Known Allergies   Patient Care Team: Carlean Jews, NP as PCP - General (Family Medicine)   Outpatient Medications Prior to Visit  Medication Sig   Multiple Vitamins-Minerals (MULTI-VITAMIN GUMMIES PO) Take by mouth.   [DISCONTINUED] atorvastatin (LIPITOR) 10 MG tablet Take 1 tablet (10 mg total) by mouth daily.   [DISCONTINUED] ketoconazole (NIZORAL) 2 % cream Apply 1 Application topically daily. (Patient not taking: Reported on 09/04/2022)   No facility-administered medications prior to visit.    Review of Systems  Constitutional:  Negative for chills, fever and malaise/fatigue.  HENT:  Negative for congestion and hearing loss.   Eyes:  Negative for blurred vision and double vision.  Respiratory:  Negative for cough and shortness of breath.   Cardiovascular:  Negative for chest pain, palpitations and leg swelling.  Gastrointestinal:  Negative for abdominal pain, constipation, diarrhea and heartburn.  Genitourinary:  Negative for frequency and urgency.  Musculoskeletal:  Negative for myalgias and neck pain.  Neurological:  Negative for headaches.  Endo/Heme/Allergies:  Negative for polydipsia.  Psychiatric/Behavioral:  Negative for depression. The patient is not nervous/anxious and does not have insomnia.       Objective:    BP 108/75   Pulse 81   Ht 5' 6.5" (1.689 m)   Wt 176 lb 12 oz (80.2 kg)   SpO2 96%   BMI 28.10 kg/m    Physical Exam Constitutional:      General: He is not in acute distress.    Appearance: Normal appearance.  HENT:     Head: Normocephalic and atraumatic.     Right Ear: Ear canal and external ear normal. There is impacted cerumen.     Left Ear: Ear canal and external ear normal. There is impacted cerumen.     Ears:     Comments: TM not visualized due to cerumen    Nose: Nose normal.     Mouth/Throat:      Mouth: Mucous membranes are moist.     Pharynx: Oropharynx is clear. No posterior oropharyngeal erythema.  Eyes:     Extraocular Movements: Extraocular movements intact.     Conjunctiva/sclera: Conjunctivae normal.     Pupils: Pupils are equal, round, and reactive to light.     Comments: Wears glasses, up-to-date  Neck:     Thyroid: No thyroid mass, thyromegaly or thyroid tenderness.  Cardiovascular:     Rate and Rhythm: Normal rate and regular rhythm.     Heart sounds: Normal heart sounds. No murmur heard.    No friction rub. No gallop.  Pulmonary:     Effort: Pulmonary effort is normal. No respiratory distress.     Breath sounds: Normal breath sounds. No stridor. No wheezing or rales.  Abdominal:     General: Bowel sounds are normal.     Palpations: Abdomen is soft. There is no mass.     Tenderness: There is no abdominal tenderness.  Musculoskeletal:        General: Normal range of motion.     Cervical back: Normal range of motion and neck supple.  Lymphadenopathy:     Cervical: No cervical adenopathy.  Skin:    General: Skin is warm and dry.  Neurological:     Mental Status: He is alert and oriented to person, place, and time.     Cranial Nerves: No cranial nerve deficit.     Motor: No weakness.     Deep Tendon Reflexes: Reflexes normal.  Psychiatric:        Mood and Affect: Mood normal.        Assessment & Plan:    Routine Health Maintenance and Physical Exam  Immunization History  Administered Date(s) Administered   Influenza,inj,Quad PF,6+ Mos 11/20/2014, 12/01/2015, 12/10/2016, 12/18/2017, 01/10/2019   Influenza-Unspecified 11/11/2019   PFIZER(Purple Top)SARS-COV-2 Vaccination 04/18/2019, 05/21/2019, 11/11/2019   Tdap 11/20/2014    Health Maintenance  Topic Date Due   Hepatitis C Screening  Never done   COVID-19 Vaccine (4 - 2023-24 season) 10/11/2021   Colonoscopy  04/03/2022   INFLUENZA VACCINE  09/11/2022   DTaP/Tdap/Td (2 - Td or Tdap) 11/19/2024    HIV Screening  Completed   HPV VACCINES  Aged Out   We discussed the recommendations and options for colorectal cancer screening.  Patient has opted for colonoscopy, orders placed.  Discussed screening for hepatitis C and will order with lab work for  annual physical next year.  Discussed health benefits of physical activity, and encouraged him to engage in regular exercise appropriate for his age and condition.  Wellness examination -     CBC with Differential/Platelet; Future -     Comprehensive metabolic panel; Future -     Hemoglobin A1c; Future -     Lipid panel; Future -     TSH Rfx on Abnormal to Free T4; Future -     VITAMIN D 25 Hydroxy (Vit-D Deficiency, Fractures); Future  BMI 27.0-27.9,adult -     CBC with Differential/Platelet; Future -     Comprehensive metabolic panel; Future -     Hemoglobin A1c; Future -     Lipid panel; Future -     TSH Rfx on Abnormal to Free T4; Future -     VITAMIN D 25 Hydroxy (Vit-D Deficiency, Fractures); Future  Elevated TSH -     TSH Rfx on Abnormal to Free T4; Future  Hyperlipidemia LDL goal <100 Assessment & Plan: Last lipid panel: LDL 119, HDL 36, triglycerides 82.  Continue atorvastatin 10 mg daily.  Will adjust management as indicated by lab results.  Orders: -     Lipid panel; Future  Screening for colorectal cancer -     Ambulatory referral to Gastroenterology  Mixed hyperlipidemia -     Atorvastatin Calcium; Take 1 tablet (10 mg total) by mouth daily.  Dispense: 90 tablet; Refill: 3    Return in about 1 year (around 09/04/2023) for annual physical, fasting blood work 1 week before.     Melida Quitter, PA

## 2022-09-05 LAB — COMPREHENSIVE METABOLIC PANEL
Calcium: 9.6 mg/dL (ref 8.7–10.2)
Creatinine, Ser: 1.08 mg/dL (ref 0.76–1.27)

## 2022-09-05 LAB — VITAMIN D 25 HYDROXY (VIT D DEFICIENCY, FRACTURES): Vit D, 25-Hydroxy: 40.4 ng/mL (ref 30.0–100.0)

## 2022-09-05 LAB — LIPID PANEL: Triglycerides: 64 mg/dL (ref 0–149)

## 2022-09-05 LAB — CBC WITH DIFFERENTIAL/PLATELET: Platelets: 231 10*3/uL (ref 150–450)

## 2022-09-05 NOTE — Addendum Note (Signed)
Addended by: Saralyn Pilar on: 09/05/2022 11:03 AM   Modules accepted: Orders

## 2023-03-19 ENCOUNTER — Encounter: Payer: Self-pay | Admitting: Family Medicine

## 2023-09-04 ENCOUNTER — Encounter: Payer: Self-pay | Admitting: Family Medicine

## 2023-09-18 ENCOUNTER — Other Ambulatory Visit: Payer: Self-pay | Admitting: Family Medicine

## 2023-09-18 DIAGNOSIS — E785 Hyperlipidemia, unspecified: Secondary | ICD-10-CM

## 2023-09-21 ENCOUNTER — Ambulatory Visit (INDEPENDENT_AMBULATORY_CARE_PROVIDER_SITE_OTHER): Payer: Self-pay

## 2023-09-21 VITALS — BP 116/82 | HR 70 | Temp 97.7°F | Ht 66.5 in | Wt 183.0 lb

## 2023-09-21 DIAGNOSIS — Z130 Encounter for screening for diseases of the blood and blood-forming organs and certain disorders involving the immune mechanism: Secondary | ICD-10-CM

## 2023-09-21 DIAGNOSIS — E785 Hyperlipidemia, unspecified: Secondary | ICD-10-CM

## 2023-09-21 DIAGNOSIS — R454 Irritability and anger: Secondary | ICD-10-CM | POA: Diagnosis not present

## 2023-09-21 DIAGNOSIS — G4733 Obstructive sleep apnea (adult) (pediatric): Secondary | ICD-10-CM | POA: Insufficient documentation

## 2023-09-21 DIAGNOSIS — Z1159 Encounter for screening for other viral diseases: Secondary | ICD-10-CM

## 2023-09-21 DIAGNOSIS — Z1211 Encounter for screening for malignant neoplasm of colon: Secondary | ICD-10-CM

## 2023-09-21 DIAGNOSIS — I951 Orthostatic hypotension: Secondary | ICD-10-CM

## 2023-09-21 DIAGNOSIS — J309 Allergic rhinitis, unspecified: Secondary | ICD-10-CM | POA: Insufficient documentation

## 2023-09-21 DIAGNOSIS — Z1321 Encounter for screening for nutritional disorder: Secondary | ICD-10-CM

## 2023-09-21 DIAGNOSIS — Z13228 Encounter for screening for other metabolic disorders: Secondary | ICD-10-CM

## 2023-09-21 DIAGNOSIS — G4730 Sleep apnea, unspecified: Secondary | ICD-10-CM | POA: Diagnosis not present

## 2023-09-21 DIAGNOSIS — Z1212 Encounter for screening for malignant neoplasm of rectum: Secondary | ICD-10-CM

## 2023-09-21 DIAGNOSIS — Z1329 Encounter for screening for other suspected endocrine disorder: Secondary | ICD-10-CM

## 2023-09-21 MED ORDER — SERTRALINE HCL 50 MG PO TABS: 50.0000 mg | ORAL_TABLET | Freq: Every day | ORAL | 2 refills | Status: DC

## 2023-09-21 MED ORDER — ATORVASTATIN CALCIUM 10 MG PO TABS: 10.0000 mg | ORAL_TABLET | Freq: Every day | ORAL | 3 refills | Status: AC

## 2023-09-21 MED ORDER — FLUTICASONE PROPIONATE 50 MCG/ACT NA SUSP: 2.0000 | Freq: Every day | NASAL | 6 refills | Status: AC

## 2023-09-21 MED ORDER — LEVOCETIRIZINE DIHYDROCHLORIDE 5 MG PO TABS: 5.0000 mg | ORAL_TABLET | Freq: Every evening | ORAL | 2 refills | Status: DC

## 2023-09-21 NOTE — Progress Notes (Signed)
 Complete physical exam  Patient: Henry Harrington   DOB: 07/31/1975   48 y.o. Male  MRN: [id number]  Subjective:    Chief Complaint  Patient presents with   Annual Exam    Physical   History of Present Illness Henry Harrington is a 47 year old male who presents for an annual physical exam and evaluation of possible sleep apnea. Reports he feels good, is sleeping fairly well. Bowel movements regular. Eating well balanced. Does not currently participate in organized exercise.  Sleep-disordered breathing symptoms - Snoring present - Daytime sleepiness despite feeling rested after sleep - Occasional episodes of falling asleep on the couch during the day  Mood disturbance and irritability - Increased irritability and anger, particularly towards his children - Episodes of yelling and feeling irritated at small inconveniences   Allergic symptoms - Watery eyes, nasal congestion, and throat irritation - Symptoms have developed with age - No current use of allergy medications  Orthostatic dizziness - Episodes of dizziness, particularly when standing up quickly or getting out of bed at night - Described as similar to standing up too fast, requiring him to pause and steady himself - Has tried increasing his water intake with no improvement on symptoms  Hyperlipidemia management - Takes atorvastatin  10 mg daily for cholesterol management - No side effects from medication    Most recent fall risk assessment:    09/21/2023    8:43 AM  Fall Risk   Falls in the past year? 0  Follow up Falls evaluation completed     Most recent depression screenings:    09/21/2023    8:44 AM 09/04/2022   10:56 AM  PHQ 2/9 Scores  PHQ - 2 Score 0 0  PHQ- 9 Score 0 0    Vision:Within last year and Dental: No current dental problems and Receives regular dental care    Patient Care Team: Gayle Saddie JULIANNA DEVONNA as PCP - General (Physician Assistant)   Outpatient Medications Prior to Visit  Medication  Sig   Multiple Vitamins-Minerals (MULTI-VITAMIN GUMMIES PO) Take by mouth.   [DISCONTINUED] atorvastatin  (LIPITOR) 10 MG tablet Take 1 tablet (10 mg total) by mouth daily.   No facility-administered medications prior to visit.    ROS  Per HPI      Objective:     BP 116/82   Pulse 70   Temp 97.7 F (36.5 C) (Oral)   Ht 5' 6.5 (1.689 m)   Wt 183 lb 0.6 oz (83 kg)   SpO2 95%   BMI 29.10 kg/m    Physical Exam Constitutional:      General: He is not in acute distress.    Appearance: Normal appearance.  HENT:     Right Ear: Tympanic membrane normal.     Left Ear: Tympanic membrane normal.     Mouth/Throat:     Mouth: Mucous membranes are moist.     Comments: +PND Eyes:     Pupils: Pupils are equal, round, and reactive to light.  Cardiovascular:     Rate and Rhythm: Normal rate and regular rhythm.     Heart sounds: Normal heart sounds. No murmur heard.    No friction rub. No gallop.  Pulmonary:     Effort: Pulmonary effort is normal. No respiratory distress.     Breath sounds: Normal breath sounds.  Abdominal:     General: Abdomen is flat. Bowel sounds are normal.     Palpations: Abdomen is soft.  Musculoskeletal:  General: No swelling.     Cervical back: Neck supple.  Lymphadenopathy:     Cervical: No cervical adenopathy.  Skin:    General: Skin is warm and dry.  Neurological:     General: No focal deficit present.     Mental Status: He is alert.  Psychiatric:        Mood and Affect: Mood normal.        Behavior: Behavior normal.        Thought Content: Thought content normal.       No results found for any visits on 09/21/23.     Assessment & Plan:    Routine Health Maintenance and Physical Exam  Health Maintenance  Topic Date Due   Hepatitis C Screening  Never done   Hepatitis B Vaccine (1 of 3 - 19+ 3-dose series) Never done   Colon Cancer Screening  04/03/2022   COVID-19 Vaccine (4 - 2024-25 season) 10/12/2022   Flu Shot  09/11/2023    DTaP/Tdap/Td vaccine (2 - Td or Tdap) 11/19/2024   HIV Screening  Completed   HPV Vaccine  Aged Out   Meningitis B Vaccine  Aged Out    Discussed health benefits of physical activity, and encouraged him to engage in regular exercise appropriate for his age and condition.  Encounter for colorectal cancer screening using Cologuard test -     Cologuard  Hyperlipidemia LDL goal <100 Assessment & Plan: Rechecking lipid panel today with labs. The 10-year ASCVD risk score (Arnett DK, et al., 2019) is: 2.2% Well-managed with atorvastatin  10 mg daily without side effects. - Refill atorvastatin  10 mg with three 90-day supplies.  Orders: -     Atorvastatin  Calcium ; Take 1 tablet (10 mg total) by mouth daily.  Dispense: 90 tablet; Refill: 3 -     Lipid panel; Future -     Comprehensive metabolic panel with GFR; Future  Observed sleep apnea Assessment & Plan: Snoring and daytime sleepiness suggest possible obstructive sleep apnea. Prefers at-home sleep study. - Place referral for at-home sleep study through [facility]  Orders: -     Home sleep test -     CBC with Differential/Platelet; Future  Screening for viral disease -     Hepatitis C antibody; Future  Screening for endocrine, nutritional, metabolic and immunity disorder -     VITAMIN D  25 Hydroxy (Vit-D Deficiency, Fractures); Future -     TSH; Future -     Hemoglobin A1c; Future -     Lipid panel; Future -     Comprehensive metabolic panel with GFR; Future -     CBC with Differential/Platelet; Future  Postural hypotension Assessment & Plan: Dizziness when standing suggests orthostatic hypotension. Possible causes include dehydration, electrolyte imbalance, and allergies. - Check electrolytes with blood work. - Advise increasing electrolyte intake with drinks like Gatorade or Pedialyte. - Monitor if allergy treatment improves dizziness.   Irritability Assessment & Plan: Increased irritability and anger, particularly  with children. Open to SSRI treatment. - Prescribe sertraline  25 mg daily for 7 days, then increase to 50 mg daily. - Schedule follow-up in 8 weeks to assess response to sertraline .   Allergic rhinitis, unspecified seasonality, unspecified trigger Assessment & Plan: Watery eyes, nasal congestion, and throat irritation suggest allergic rhinitis. - Prescribe Xyzal  (levocetirizine) for daily or as-needed use. - Prescribe Flonase  (fluticasone ) nasal spray for daily or as-needed use. - Advise him to compare prescription cost with over-the-counter options.   Other orders -  Sertraline  HCl; Take 1 tablet (50 mg total) by mouth daily.  Dispense: 30 tablet; Refill: 2 -     Levocetirizine Dihydrochloride ; Take 1 tablet (5 mg total) by mouth every evening.  Dispense: 31 tablet; Refill: 2 -     Fluticasone  Propionate; Place 2 sprays into both nostrils daily.  Dispense: 16 g; Refill: 6   Return in about 8 weeks (around 11/16/2023) for Mood, dizziness.     Saddie JULIANNA Sacks, PA-C

## 2023-09-21 NOTE — Assessment & Plan Note (Signed)
 Increased irritability and anger, particularly with children. Open to SSRI treatment. - Prescribe sertraline  25 mg daily for 7 days, then increase to 50 mg daily. - Schedule follow-up in 8 weeks to assess response to sertraline .

## 2023-09-21 NOTE — Assessment & Plan Note (Signed)
 Watery eyes, nasal congestion, and throat irritation suggest allergic rhinitis. - Prescribe Xyzal  (levocetirizine) for daily or as-needed use. - Prescribe Flonase  (fluticasone ) nasal spray for daily or as-needed use. - Advise him to compare prescription cost with over-the-counter options.

## 2023-09-21 NOTE — Assessment & Plan Note (Signed)
 Dizziness when standing suggests orthostatic hypotension. Possible causes include dehydration, electrolyte imbalance, and allergies. - Check electrolytes with blood work. - Advise increasing electrolyte intake with drinks like Gatorade or Pedialyte. - Monitor if allergy treatment improves dizziness.

## 2023-09-21 NOTE — Assessment & Plan Note (Signed)
 Rechecking lipid panel today with labs. The 10-year ASCVD risk score (Arnett DK, et al., 2019) is: 2.2% Well-managed with atorvastatin  10 mg daily without side effects. - Refill atorvastatin  10 mg with three 90-day supplies.

## 2023-09-21 NOTE — Assessment & Plan Note (Signed)
 Snoring and daytime sleepiness suggest possible obstructive sleep apnea. Prefers at-home sleep study. - Place referral for at-home sleep study through [facility]

## 2023-09-21 NOTE — Patient Instructions (Signed)
 VISIT SUMMARY: Today, you had your annual physical exam and discussed several health concerns including possible sleep apnea, mood disturbances, allergies, dizziness, and cholesterol management.  YOUR PLAN: ADULT WELLNESS VISIT: Multiple health concerns were addressed during your visit. -We ordered fasting blood work to check your thyroid, kidney and liver function, cholesterol, A1c, and complete blood counts. You will receive the results via MyChart with explanations and any necessary follow-up actions.  HYPERLIPIDEMIA: Your cholesterol is well-managed with atorvastatin  10 mg daily without side effects. -We have refilled your atorvastatin  10 mg with three 90-day supplies.  POSSIBLE OBSTRUCTIVE SLEEP APNEA: Your symptoms of snoring and daytime sleepiness suggest possible obstructive sleep apnea. -We placed a referral for an at-home sleep study. Please pick up the sleep study supplies and follow the instructions for home setup.  ALLERGIC RHINITIS: Your symptoms of watery eyes, nasal congestion, and throat irritation suggest allergic rhinitis. -We prescribed Xyzal  (levocetirizine) for daily or as-needed use. -We prescribed Flonase  (fluticasone ) nasal spray for daily or as-needed use. -Compare the cost of these prescriptions with over-the-counter options.  IRRITABILITY AND MOOD SYMPTOMS: You have increased irritability and anger. -We prescribed sertraline  25 mg daily for 7 days (half tablet), then increase to 50 mg daily (whole tablet). -We scheduled a follow-up in 8 weeks to assess your response to sertraline .  DIZZINESS, POSSIBLY ORTHOSTATIC: Your dizziness when standing suggests orthostatic hypotension. -We will check your electrolytes with the blood work. -Increase your electrolyte intake with drinks like Gatorade or Pedialyte. -Monitor if allergy treatment improves your dizziness.  If you have any problems before your next visit feel free to message me via MyChart (minor issues or  questions) or call the office, otherwise you may reach out to schedule an office visit.  Thank you! Saddie Sacks, PA-C

## 2023-09-22 LAB — COMPREHENSIVE METABOLIC PANEL WITH GFR
ALT: 35 IU/L (ref 0–44)
AST: 22 IU/L (ref 0–40)
Albumin: 4.4 g/dL (ref 4.1–5.1)
Alkaline Phosphatase: 100 IU/L (ref 44–121)
BUN/Creatinine Ratio: 14 (ref 9–20)
BUN: 13 mg/dL (ref 6–24)
Bilirubin Total: 0.4 mg/dL (ref 0.0–1.2)
CO2: 19 mmol/L — ABNORMAL LOW (ref 20–29)
Calcium: 9.4 mg/dL (ref 8.7–10.2)
Chloride: 105 mmol/L (ref 96–106)
Creatinine, Ser: 0.92 mg/dL (ref 0.76–1.27)
Globulin, Total: 2.5 g/dL (ref 1.5–4.5)
Glucose: 90 mg/dL (ref 70–99)
Potassium: 4.6 mmol/L (ref 3.5–5.2)
Sodium: 141 mmol/L (ref 134–144)
Total Protein: 6.9 g/dL (ref 6.0–8.5)
eGFR: 104 mL/min/1.73 (ref >59)

## 2023-09-22 LAB — LIPID PANEL
Chol/HDL Ratio: 4.8 ratio (ref 0.0–5.0)
Cholesterol, Total: 174 mg/dL (ref 100–199)
HDL: 36 mg/dL — ABNORMAL LOW (ref >39)
LDL Chol Calc (NIH): 122 mg/dL — ABNORMAL HIGH (ref 0–99)
Triglycerides: 86 mg/dL (ref 0–149)
VLDL Cholesterol Cal: 16 mg/dL (ref 5–40)

## 2023-09-22 LAB — CBC WITH DIFFERENTIAL/PLATELET
Basophils Absolute: 0 x10E3/uL (ref 0.0–0.2)
Basos: 0 %
EOS (ABSOLUTE): 0.1 x10E3/uL (ref 0.0–0.4)
Eos: 1 %
Hematocrit: 46.5 % (ref 37.5–51.0)
Hemoglobin: 15.2 g/dL (ref 13.0–17.7)
Immature Grans (Abs): 0 x10E3/uL (ref 0.0–0.1)
Immature Granulocytes: 0 %
Lymphocytes Absolute: 1.1 x10E3/uL (ref 0.7–3.1)
Lymphs: 17 %
MCH: 30.3 pg (ref 26.6–33.0)
MCHC: 32.7 g/dL (ref 31.5–35.7)
MCV: 93 fL (ref 79–97)
Monocytes Absolute: 0.5 x10E3/uL (ref 0.1–0.9)
Monocytes: 8 %
Neutrophils Absolute: 5.1 x10E3/uL (ref 1.4–7.0)
Neutrophils: 74 %
Platelets: 242 x10E3/uL (ref 150–450)
RBC: 5.01 x10E6/uL (ref 4.14–5.80)
RDW: 12.8 % (ref 11.6–15.4)
WBC: 6.8 x10E3/uL (ref 3.4–10.8)

## 2023-09-22 LAB — TSH: TSH: 2.46 u[IU]/mL (ref 0.450–4.500)

## 2023-09-22 LAB — VITAMIN D 25 HYDROXY (VIT D DEFICIENCY, FRACTURES): Vit D, 25-Hydroxy: 27.3 ng/mL — ABNORMAL LOW (ref 30.0–100.0)

## 2023-09-22 LAB — HEPATITIS C ANTIBODY: Hep C Virus Ab: NONREACTIVE

## 2023-09-22 LAB — HEMOGLOBIN A1C
Est. average glucose Bld gHb Est-mCnc: 114 mg/dL
Hgb A1c MFr Bld: 5.6 % (ref 4.8–5.6)

## 2023-09-23 ENCOUNTER — Ambulatory Visit: Payer: Self-pay

## 2023-10-05 LAB — COLOGUARD: COLOGUARD: NEGATIVE

## 2023-11-16 ENCOUNTER — Ambulatory Visit (INDEPENDENT_AMBULATORY_CARE_PROVIDER_SITE_OTHER)

## 2023-11-16 VITALS — BP 123/83 | HR 76 | Temp 97.7°F | Ht 66.5 in | Wt 182.0 lb

## 2023-11-16 DIAGNOSIS — Z23 Encounter for immunization: Secondary | ICD-10-CM | POA: Diagnosis not present

## 2023-11-16 DIAGNOSIS — J309 Allergic rhinitis, unspecified: Secondary | ICD-10-CM | POA: Diagnosis not present

## 2023-11-16 DIAGNOSIS — G4733 Obstructive sleep apnea (adult) (pediatric): Secondary | ICD-10-CM | POA: Diagnosis not present

## 2023-11-16 DIAGNOSIS — E785 Hyperlipidemia, unspecified: Secondary | ICD-10-CM

## 2023-11-16 DIAGNOSIS — G4730 Sleep apnea, unspecified: Secondary | ICD-10-CM | POA: Diagnosis not present

## 2023-11-16 DIAGNOSIS — R454 Irritability and anger: Secondary | ICD-10-CM

## 2023-11-16 MED ORDER — AZELASTINE HCL 0.1 % NA SOLN: 2.0000 | Freq: Two times a day (BID) | NASAL | 12 refills | Status: AC

## 2023-11-16 NOTE — Assessment & Plan Note (Signed)
 Chronic rhinitis causing nasal obstruction. No nasal polyps observed. - Continue Flonase  nasal spray and Xyzal  daily.  - Add azelastine nasal spray, one spray in each nostril once daily, may increase to two sprays twice daily if needed. - Send azelastine prescription to [facility].

## 2023-11-16 NOTE — Assessment & Plan Note (Signed)
 Severe obstructive sleep apnea confirmed by sleep study with AHI of 44.2. - Order CPAP supplies with settings based on sleep study. - Instruct him to contact if CPAP settings need adjustment. - Plan to repeat sleep study in one year to assess progress.

## 2023-11-16 NOTE — Assessment & Plan Note (Signed)
 Irritability managed with sertraline  50 mg daily. Reports improvement in irritability. - Continue sertraline  50 mg daily. - Instruct him to message if dose adjustment is needed.

## 2023-11-16 NOTE — Patient Instructions (Signed)
 VISIT SUMMARY: Today, we discussed your severe sleep apnea, nasal congestion, mood disturbance, and hyperlipidemia. We reviewed your current medications and made some adjustments to help manage your symptoms more effectively.  YOUR PLAN: -OBSTRUCTIVE SLEEP APNEA: Obstructive sleep apnea is a condition where your airway becomes blocked during sleep, causing breathing pauses. We will order CPAP supplies based on your recent sleep study. Please contact us  if the CPAP settings need adjustment. We plan to repeat the sleep study in one year to check your progress.  -CHRONIC RHINITIS WITH TURBINATE INFLAMMATION: Chronic rhinitis is long-term inflammation of the nasal passages, causing congestion. Continue using Flonase  nasal spray as directed. We are adding azelastine nasal spray, one spray in each nostril once daily, and you may increase to two sprays twice daily if needed. The prescription for azelastine will be sent to [facility].  -HYPERLIPIDEMIA: Hyperlipidemia is having high levels of fats (lipids) in your blood, which can increase your risk of heart disease. Continue taking atorvastatin  as prescribed. Using CPAP for sleep apnea may also help improve your cholesterol levels.  INSTRUCTIONS: Please follow up with us  if you experience any issues with your CPAP settings or if you feel you need a dose adjustment for your sertraline . We will repeat your sleep study in one year to assess your progress.  If you have any problems before your next visit feel free to message me via MyChart (minor issues or questions) or call the office, otherwise you may reach out to schedule an office visit.  Thank you! Saddie Sacks, PA-C

## 2023-11-16 NOTE — Assessment & Plan Note (Signed)
 Updated lipid panel showed the following: LDL 122, HDL 36, Trig 86. The 10-year ASCVD risk score (Arnett DK, et al., 2019) is: 2.5% Continue atorvastatin  10 mg daily. CMP WNL. Will cont to monitor.

## 2023-11-16 NOTE — Progress Notes (Signed)
 Established Patient Office Visit  Subjective   Patient ID: Henry Harrington, male    DOB: 17-May-1975  Age: 48 y.o. MRN: [id number]  Chief Complaint  Patient presents with   Medical Management of Chronic Issues    HPI  History of Present Illness   DESMEN SCHOFFSTALL is a 48 year old male who presents with severe sleep apnea.  Obstructive sleep apnea - Severe sleep apnea diagnosed by recent sleep study with apnea-hypopnea index of 44.2 - Open to CPAP therapy  Nasal congestion - Uses Flonase  nasal spray, one spray in each nostril in the morning and at night - Variable nasal airflow, sometimes with unilateral nasal obstruction  Mood disturbance and irritability - Takes sertraline  50 mg daily - Improvement in irritability with sertraline    Hyperlipidemia management - Takes atorvastatin , no adverse effects - Recently refilled atorvastatin  prescription  Medication adherence and travel preparation - Recently refilled prescriptions for atorvastatin  and sertraline  - Ensured adequate supply of medications for upcoming trip to [facility] from Thursday to Sunday          ROS Per HPI.    Objective:     BP 123/83   Pulse 76   Temp 97.7 F (36.5 C) (Oral)   Ht 5' 6.5 (1.689 m)   Wt 182 lb 0.6 oz (82.6 kg)   SpO2 96%   BMI 28.94 kg/m    Physical Exam Constitutional:      General: He is not in acute distress.    Appearance: Normal appearance.  HENT:     Nose:     Right Turbinates: Swollen.     Left Turbinates: Swollen.  Cardiovascular:     Rate and Rhythm: Normal rate and regular rhythm.     Heart sounds: Normal heart sounds. No murmur heard.    No friction rub. No gallop.  Pulmonary:     Effort: Pulmonary effort is normal. No respiratory distress.     Breath sounds: Normal breath sounds.  Musculoskeletal:        General: No swelling.  Skin:    General: Skin is warm and dry.  Neurological:     General: No focal deficit present.     Mental Status: He is alert.   Psychiatric:        Mood and Affect: Mood normal.        Behavior: Behavior normal.        Thought Content: Thought content normal.      No results found for any visits on 11/16/23.  Last CBC Lab Results  Component Value Date   WBC 6.8 09/21/2023   HGB 15.2 09/21/2023   HCT 46.5 09/21/2023   MCV 93 09/21/2023   MCH 30.3 09/21/2023   RDW 12.8 09/21/2023   PLT 242 09/21/2023   Last metabolic panel Lab Results  Component Value Date   GLUCOSE 90 09/21/2023   NA 141 09/21/2023   K 4.6 09/21/2023   CL 105 09/21/2023   CO2 19 (L) 09/21/2023   BUN 13 09/21/2023   CREATININE 0.92 09/21/2023   EGFR 104 09/21/2023   CALCIUM  9.4 09/21/2023   PROT 6.9 09/21/2023   ALBUMIN 4.4 09/21/2023   LABGLOB 2.5 09/21/2023   AGRATIO 1.8 08/20/2021   BILITOT 0.4 09/21/2023   ALKPHOS 100 09/21/2023   AST 22 09/21/2023   ALT 35 09/21/2023   Last lipids Lab Results  Component Value Date   CHOL 174 09/21/2023   HDL 36 (L) 09/21/2023   LDLCALC 122 (H)  09/21/2023   TRIG 86 09/21/2023   CHOLHDL 4.8 09/21/2023   Last hemoglobin A1c Lab Results  Component Value Date   HGBA1C 5.6 09/21/2023   Last thyroid functions Lab Results  Component Value Date   TSH 2.460 09/21/2023   Last vitamin D  Lab Results  Component Value Date   VD25OH 27.3 (L) 09/21/2023      The 10-year ASCVD risk score (Arnett DK, et al., 2019) is: 2.5%    Assessment & Plan:   Encounter for vaccination -     Flu vaccine trivalent PF, 6mos and older(Flulaval,Afluria,Fluarix,Fluzone)  Severe obstructive sleep apnea -     For home use only DME continuous positive airway pressure (CPAP)  Observed sleep apnea Assessment & Plan: Severe obstructive sleep apnea confirmed by sleep study with AHI of 44.2. - Order CPAP supplies with settings based on sleep study. - Instruct him to contact if CPAP settings need adjustment. - Plan to repeat sleep study in one year to assess progress.   Allergic rhinitis,  unspecified seasonality, unspecified trigger Assessment & Plan: Chronic rhinitis causing nasal obstruction. No nasal polyps observed. - Continue Flonase  nasal spray and Xyzal  daily.  - Add azelastine nasal spray, one spray in each nostril once daily, may increase to two sprays twice daily if needed. - Send azelastine prescription to [facility].   Irritability Assessment & Plan:  Irritability managed with sertraline  50 mg daily. Reports improvement in irritability. - Continue sertraline  50 mg daily. - Instruct him to message if dose adjustment is needed.   Hyperlipidemia LDL goal <100 Assessment & Plan: Updated lipid panel showed the following: LDL 122, HDL 36, Trig 86. The 10-year ASCVD risk score (Arnett DK, et al., 2019) is: 2.5% Continue atorvastatin  10 mg daily. CMP WNL. Will cont to monitor.   Other orders -     Azelastine HCl; Place 2 sprays into both nostrils 2 (two) times daily. Use in each nostril as directed  Dispense: 30 mL; Refill: 12    Return in about 4 months (around 03/18/2024) for Mood, HLD, sleep apnea.    Saddie JULIANNA Sacks, PA-C

## 2023-12-23 ENCOUNTER — Other Ambulatory Visit: Payer: Self-pay

## 2024-01-11 ENCOUNTER — Ambulatory Visit (INDEPENDENT_AMBULATORY_CARE_PROVIDER_SITE_OTHER)

## 2024-01-11 VITALS — BP 109/66 | HR 80 | Temp 98.0°F | Ht 66.5 in | Wt 185.0 lb

## 2024-01-11 DIAGNOSIS — J069 Acute upper respiratory infection, unspecified: Secondary | ICD-10-CM | POA: Insufficient documentation

## 2024-01-11 DIAGNOSIS — J309 Allergic rhinitis, unspecified: Secondary | ICD-10-CM | POA: Diagnosis not present

## 2024-01-11 DIAGNOSIS — G4733 Obstructive sleep apnea (adult) (pediatric): Secondary | ICD-10-CM | POA: Diagnosis not present

## 2024-01-11 DIAGNOSIS — R051 Acute cough: Secondary | ICD-10-CM | POA: Diagnosis not present

## 2024-01-11 LAB — POC COVID19/FLU A&B COMBO
Covid Antigen, POC: NEGATIVE
Influenza A Antigen, POC: NEGATIVE
Influenza B Antigen, POC: NEGATIVE

## 2024-01-11 MED ORDER — MONTELUKAST SODIUM 10 MG PO TABS: 10.0000 mg | ORAL_TABLET | Freq: Every day | ORAL | 3 refills | Status: AC

## 2024-01-11 MED ORDER — METHYLPREDNISOLONE 4 MG PO TBPK: ORAL_TABLET | ORAL | 0 refills | Status: AC

## 2024-01-11 MED ORDER — HYDROCODONE BIT-HOMATROP MBR 5-1.5 MG/5ML PO SOLN: 5.0000 mL | Freq: Four times a day (QID) | ORAL | 0 refills | Status: AC | PRN

## 2024-01-11 NOTE — Assessment & Plan Note (Signed)
 Likely viral infection due to recent exposure. POC Covid/Flu negative. Will do methyprednisolone taper to reduce airway inflammation.  Discussed side effects. Prescribed hydrocodone cough syrup with caution for drowsiness. - Advised to monitor symptoms and contact if no improvement in 5-7 days for potential antibiotic treatment. - Continue with over the counter efforts, rest, fluids.

## 2024-01-11 NOTE — Patient Instructions (Addendum)
 VISIT SUMMARY: Today, we addressed your cough with green mucus, nasal congestion, and sleep apnea. We discussed your symptoms, prescribed medications, and provided guidance on monitoring and follow-up care.  YOUR PLAN: ACUTE UPPER RESPIRATORY INFECTION WITH COUGH AND GREEN SPUTUM: You likely have a viral infection, possibly bronchitis, due to recent exposure. -We ordered COVID and flu swabs to rule out these infections. -You have been prescribed a prednisone taper pack to reduce inflammation. -You have been prescribed hydrocodone cough syrup to be taken every six hours as needed. Be cautious of drowsiness and potential allergies. -Monitor your symptoms and contact us  if there is no improvement in 2-3 days for potential antibiotic treatment.  CHRONIC NASAL CONGESTION AND SUSPECTED ALLERGIC RHINITIS: You have ongoing nasal congestion, likely due to allergies, with more pronounced obstruction on the left side. -You have been prescribed Singulair to help with your symptoms. -Continue using azelastine , Flonase , and Xyzal  as previously directed. -Monitor your symptoms and consider an ENT referral if there is no improvement after one month.  OBSTRUCTIVE SLEEP APNEA ON CPAP THERAPY: You are using CPAP therapy for sleep apnea but still experiencing variable sleep quality and occasional fatigue. -Continue using your CPAP machine with the current settings. -Cancel next week's CPAP follow-up appointment. -We have scheduled a follow-up appointment for you in February. -Monitor your symptoms and contact us  if fatigue persists.  If you have any problems before your next visit feel free to message me via MyChart (minor issues or questions) or call the office, otherwise you may reach out to schedule an office visit.  Thank you! Saddie Sacks, PA-C

## 2024-01-11 NOTE — Assessment & Plan Note (Signed)
 Chronic allergic rhinitis with little improvement on consistent use of Xyzal , flonase , and azelastine .  Considered Singulair before ENT referral. Discussed Singulair side effects. - Prescribed Singulair 10 mg at bedtime.  - Continue azelastine , Flonase , and Xyzal . - Advised to monitor symptoms and consider ENT referral if no improvement after one month.

## 2024-01-11 NOTE — Progress Notes (Signed)
 Acute Office Visit  Subjective:     Patient ID: Henry Harrington, male    DOB: [birth year], 48 y.o.   MRN: [id number]  Chief Complaint  Patient presents with   Cough    HPI   History of Present Illness   Henry Harrington is a 48 year old male who presents with cough and green mucus production.  Cough and sputum production - Onset of cough yesterday - Productive of green mucus - No fever or body aches - Wife recently treated for possible bronchitis; possible exposure - Using over-the-counter generic cough syrup at home  Nasal congestion and obstruction - Using azelastine , Flonase , and Xyzal  for chronic nasal congestion x 3 months  - Uncertain effectiveness of current medications as he is still having intermittent difficulty breathing through nose and chronic congestion  - Alternating nasal obstruction, more pronounced on the left side today   Sleep disturbance and daytime fatigue - Uses CPAP machine for sleep apnea, recently started  - Some fatigue and occasional daytime sleepiness despite CPAP use - Sleep study showed improvement in number of events per hour - Tracking sleep score through app  - Still adjusting to CPAP mask         ROS Per HPI     Objective:    BP 109/66   Pulse 80   Temp 98 F (36.7 C) (Oral)   Ht 5' 6.5 (1.689 m)   Wt 185 lb 0.6 oz (83.9 kg)   SpO2 95%   BMI 29.42 kg/m    Physical Exam Constitutional:      Appearance: Normal appearance.  HENT:     Nose: Mucosal edema, congestion and rhinorrhea present. Rhinorrhea is purulent.     Right Turbinates: Swollen.     Left Turbinates: Swollen.     Right Sinus: Frontal sinus tenderness present.     Left Sinus: Frontal sinus tenderness present.     Mouth/Throat:     Pharynx: Posterior oropharyngeal erythema and postnasal drip present.  Cardiovascular:     Rate and Rhythm: Normal rate and regular rhythm.     Pulses: Normal pulses.     Heart sounds: Normal heart sounds.  Pulmonary:      Effort: Pulmonary effort is normal.     Breath sounds: Normal breath sounds.  Abdominal:     General: Bowel sounds are normal.  Musculoskeletal:        General: No swelling. Normal range of motion.     Cervical back: Neck supple.  Lymphadenopathy:     Cervical: No cervical adenopathy.  Skin:    General: Skin is warm and dry.  Neurological:     General: No focal deficit present.     Mental Status: He is alert.  Psychiatric:        Mood and Affect: Mood normal.        Behavior: Behavior normal.     Results for orders placed or performed in visit on 01/11/24  POC Covid19/Flu A&B Antigen  Result Value Ref Range   Influenza A Antigen, POC Negative Negative   Influenza B Antigen, POC Negative Negative   Covid Antigen, POC Negative Negative        Assessment & Plan:   Acute cough -     POC Covid19/Flu A&B Antigen  Viral URI with cough Assessment & Plan: Likely viral infection due to recent exposure. POC Covid/Flu negative. Will do methyprednisolone taper to reduce airway inflammation.  Discussed side effects. Prescribed hydrocodone cough syrup with  caution for drowsiness. - Advised to monitor symptoms and contact if no improvement in 5-7 days for potential antibiotic treatment. - Continue with over the counter efforts, rest, fluids.     Chronic allergic rhinitis Assessment & Plan: Chronic allergic rhinitis with little improvement on consistent use of Xyzal , flonase , and azelastine .  Considered Singulair before ENT referral. Discussed Singulair side effects. - Prescribed Singulair 10 mg at bedtime.  - Continue azelastine , Flonase , and Xyzal . - Advised to monitor symptoms and consider ENT referral if no improvement after one month.    Obstructive sleep apnea treated with continuous positive airway pressure (CPAP) Assessment & Plan: Managed with CPAP therapy. Reports variable sleep quality and occasional fatigue. CPAP data shows improvement. Discussed potential for  improvement with continued use and mask adjustment options. - Continue CPAP therapy with current settings. - Advised to cancel next week's CPAP follow-up appointment since we addressed progress today.  - Scheduled follow-up appointment in February. - Advised to monitor symptoms and contact if fatigue persists.    Other orders -     HYDROcodone Bit-Homatrop MBr; Take 5 mLs by mouth every 6 (six) hours as needed for cough.  Dispense: 120 mL; Refill: 0 -     methylPREDNISolone; Follow instructions on dosing pack  Dispense: 1 each; Refill: 0 -     Montelukast Sodium; Take 1 tablet (10 mg total) by mouth at bedtime.  Dispense: 30 tablet; Refill: 3     Return if symptoms worsen or fail to improve.  Saddie JULIANNA Sacks, PA-C

## 2024-01-11 NOTE — Assessment & Plan Note (Signed)
 Managed with CPAP therapy. Reports variable sleep quality and occasional fatigue. CPAP data shows improvement. Discussed potential for improvement with continued use and mask adjustment options. - Continue CPAP therapy with current settings. - Advised to cancel next week's CPAP follow-up appointment since we addressed progress today.  - Scheduled follow-up appointment in February. - Advised to monitor symptoms and contact if fatigue persists.

## 2024-01-18 ENCOUNTER — Other Ambulatory Visit: Payer: Self-pay

## 2024-01-19 ENCOUNTER — Ambulatory Visit

## 2024-02-01 NOTE — Telephone Encounter (Signed)
 If the cough is productive, he could try Musinex DM or famotidine

## 2024-03-14 ENCOUNTER — Other Ambulatory Visit

## 2024-03-14 ENCOUNTER — Other Ambulatory Visit: Payer: Self-pay

## 2024-03-14 DIAGNOSIS — G4733 Obstructive sleep apnea (adult) (pediatric): Secondary | ICD-10-CM

## 2024-03-14 DIAGNOSIS — E559 Vitamin D deficiency, unspecified: Secondary | ICD-10-CM

## 2024-03-14 DIAGNOSIS — E785 Hyperlipidemia, unspecified: Secondary | ICD-10-CM

## 2024-03-14 NOTE — Progress Notes (Signed)
 c

## 2024-03-16 ENCOUNTER — Other Ambulatory Visit: Payer: Self-pay

## 2024-03-18 ENCOUNTER — Other Ambulatory Visit

## 2024-03-18 DIAGNOSIS — E559 Vitamin D deficiency, unspecified: Secondary | ICD-10-CM

## 2024-03-18 DIAGNOSIS — E785 Hyperlipidemia, unspecified: Secondary | ICD-10-CM

## 2024-03-18 DIAGNOSIS — G4733 Obstructive sleep apnea (adult) (pediatric): Secondary | ICD-10-CM

## 2024-03-21 ENCOUNTER — Ambulatory Visit
